# Patient Record
Sex: Female | Born: 1980 | Race: White | Hispanic: No | Marital: Married | Smoking: Never smoker
Health system: Southern US, Community
[De-identification: ages and names within clinical notes are randomized; demographics above are authoritative.]

## PROBLEM LIST (undated history)

## (undated) ENCOUNTER — Inpatient Hospital Stay (HOSPITAL_COMMUNITY): Payer: Self-pay

## (undated) DIAGNOSIS — B977 Papillomavirus as the cause of diseases classified elsewhere: Secondary | ICD-10-CM

## (undated) DIAGNOSIS — Z8619 Personal history of other infectious and parasitic diseases: Secondary | ICD-10-CM

## (undated) DIAGNOSIS — O24419 Gestational diabetes mellitus in pregnancy, unspecified control: Secondary | ICD-10-CM

## (undated) DIAGNOSIS — F419 Anxiety disorder, unspecified: Secondary | ICD-10-CM

## (undated) DIAGNOSIS — N979 Female infertility, unspecified: Secondary | ICD-10-CM

## (undated) HISTORY — DX: Female infertility, unspecified: N97.9

## (undated) HISTORY — PX: OTHER SURGICAL HISTORY: SHX169

## (undated) HISTORY — DX: Personal history of other infectious and parasitic diseases: Z86.19

## (undated) HISTORY — PX: WISDOM TOOTH EXTRACTION: SHX21

## (undated) HISTORY — PX: CHOLECYSTECTOMY: SHX55

## (undated) HISTORY — PX: LEEP: SHX91

## (undated) HISTORY — DX: Gestational diabetes mellitus in pregnancy, unspecified control: O24.419

## (undated) HISTORY — PX: COLPOSCOPY: SHX161

---
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
## 2000-10-20 ENCOUNTER — Other Ambulatory Visit: Admission: RE | Admit: 2000-10-20 | Discharge: 2000-10-20 | Payer: Self-pay | Admitting: Obstetrics & Gynecology

## 2000-10-20 ENCOUNTER — Other Ambulatory Visit: Admission: RE | Admit: 2000-10-20 | Discharge: 2000-10-20 | Payer: Self-pay | Admitting: Gynecology

## 2001-04-25 ENCOUNTER — Ambulatory Visit (HOSPITAL_COMMUNITY): Admission: RE | Admit: 2001-04-25 | Discharge: 2001-04-25 | Payer: Self-pay | Admitting: Internal Medicine

## 2001-10-25 ENCOUNTER — Other Ambulatory Visit: Admission: RE | Admit: 2001-10-25 | Discharge: 2001-10-25 | Payer: Self-pay | Admitting: Obstetrics and Gynecology

## 2002-11-13 ENCOUNTER — Other Ambulatory Visit: Admission: RE | Admit: 2002-11-13 | Discharge: 2002-11-13 | Payer: Self-pay | Admitting: Obstetrics and Gynecology

## 2002-11-14 ENCOUNTER — Other Ambulatory Visit: Admission: RE | Admit: 2002-11-14 | Discharge: 2002-11-14 | Payer: Self-pay | Admitting: Obstetrics and Gynecology

## 2003-09-13 ENCOUNTER — Other Ambulatory Visit: Admission: RE | Admit: 2003-09-13 | Discharge: 2003-09-13 | Payer: Self-pay | Admitting: Obstetrics and Gynecology

## 2003-12-27 ENCOUNTER — Other Ambulatory Visit: Admission: RE | Admit: 2003-12-27 | Discharge: 2003-12-27 | Payer: Self-pay | Admitting: Obstetrics and Gynecology

## 2004-06-04 ENCOUNTER — Other Ambulatory Visit: Admission: RE | Admit: 2004-06-04 | Discharge: 2004-06-04 | Payer: Self-pay | Admitting: Obstetrics and Gynecology

## 2004-12-11 ENCOUNTER — Other Ambulatory Visit: Admission: RE | Admit: 2004-12-11 | Discharge: 2004-12-11 | Payer: Self-pay | Admitting: Obstetrics and Gynecology

## 2004-12-17 ENCOUNTER — Other Ambulatory Visit: Admission: RE | Admit: 2004-12-17 | Discharge: 2004-12-17 | Payer: Self-pay | Admitting: Obstetrics and Gynecology

## 2005-02-23 ENCOUNTER — Ambulatory Visit (HOSPITAL_COMMUNITY): Admission: RE | Admit: 2005-02-23 | Discharge: 2005-02-24 | Payer: Self-pay | Admitting: Internal Medicine

## 2005-02-26 ENCOUNTER — Ambulatory Visit: Payer: Self-pay | Admitting: *Deleted

## 2005-05-21 ENCOUNTER — Other Ambulatory Visit: Admission: RE | Admit: 2005-05-21 | Discharge: 2005-05-21 | Payer: Self-pay | Admitting: Obstetrics and Gynecology

## 2006-09-14 ENCOUNTER — Ambulatory Visit (HOSPITAL_COMMUNITY): Admission: RE | Admit: 2006-09-14 | Discharge: 2006-09-14 | Payer: Self-pay | Admitting: Obstetrics and Gynecology

## 2006-09-14 ENCOUNTER — Encounter (INDEPENDENT_AMBULATORY_CARE_PROVIDER_SITE_OTHER): Payer: Self-pay | Admitting: Specialist

## 2007-05-08 ENCOUNTER — Emergency Department (HOSPITAL_COMMUNITY): Admission: EM | Admit: 2007-05-08 | Discharge: 2007-05-08 | Payer: Self-pay | Admitting: Emergency Medicine

## 2007-06-13 ENCOUNTER — Ambulatory Visit: Payer: Self-pay | Admitting: Cardiology

## 2007-08-03 ENCOUNTER — Encounter: Payer: Self-pay | Admitting: Cardiology

## 2007-08-03 ENCOUNTER — Ambulatory Visit: Payer: Self-pay

## 2008-12-31 ENCOUNTER — Ambulatory Visit (HOSPITAL_COMMUNITY): Admission: RE | Admit: 2008-12-31 | Discharge: 2008-12-31 | Payer: Self-pay | Admitting: Internal Medicine

## 2008-12-31 ENCOUNTER — Ambulatory Visit: Payer: Self-pay | Admitting: Internal Medicine

## 2009-07-17 ENCOUNTER — Ambulatory Visit (HOSPITAL_COMMUNITY): Admission: RE | Admit: 2009-07-17 | Discharge: 2009-07-17 | Payer: Self-pay | Admitting: Obstetrics and Gynecology

## 2010-04-15 ENCOUNTER — Ambulatory Visit (HOSPITAL_COMMUNITY)
Admission: RE | Admit: 2010-04-15 | Discharge: 2010-04-15 | Payer: Self-pay | Source: Home / Self Care | Attending: Internal Medicine | Admitting: Internal Medicine

## 2010-09-16 NOTE — Op Note (Signed)
Jennifer Mccormick, Jennifer Mccormick            ACCOUNT NO.:  [id number]   MEDICAL RECORD NO.:  [id number]          PATIENT TYPE:  AMB   LOCATION:  DAY                           FACILITY:  [facility]   PHYSICIAN:  R. Roetta Sessions, M.D. DATE OF BIRTH:  11-Aug-1980   DATE OF PROCEDURE:  12/31/2008  DATE OF DISCHARGE:                               OPERATIVE REPORT   INDICATIONS FOR PROCEDURE:  A 30 year old lady with a 5-day history of  difficulty swallowing pills and solid food.  She has a history of panic  attacks,  came off her Prozac recently but has started back on that  agent prior to 5 days ago.  Absolutely denies any odynophagia or  dysphagia.  There are no signs of reflux symptoms, nausea, vomiting,  melena, hematochezia.  She is here for EGD.  She is referred by Dr.  Ouida Sills. Risks, benefits, alternatives, limitations have been discussed  with the patient at bedside and questions answered.  Potential for  esophageal dilation as appropriate reviewed.  Please see documentation  in the medical record.   PROCEDURE NOTE:  O2 saturation, blood pressure, pulse and respirations  were monitored throughout the entire procedure.   CONSCIOUS SEDATION:  Versed 7 mg IV,. Demerol 150 mg IV in divided  doses.  Cetacaine spray for topical pharyngeal anesthesia.   INSTRUMENT:  Pentax video chip system.   FINDINGS:  Examination of tubular esophagus revealed a widely patent,  normal-appearing esophageal mucosa.  EG junction was easily traversed  and __________ gastric cavity was emptied and insufflated well with air.  Thorough examination of gastric mucosa included retroflexion into the  proximal stomach, esophagogastric junction, which demonstrated a couple  of tiny antral erosions.  There was no infiltrating process or ulcer or  other abnormality.  Pylorus was patent and easily traversed.  Examination of bulb and second portion revealed no abnormalities.   THERAPEUTIC/DIAGNOSTIC MANEUVERS PERFORMED:  None.   The patient tolerated the procedure well __________ endoscopy.   IMPRESSION:  Normal tubular esophagus, normal esophageal mucosa, tiny  antral erosions, otherwise normal stomach, D1, D2.   I suspect symptoms may be falling more into the realm of globus  hystericus.  Today's findings are reassuring.   RECOMMENDATIONS:  1. Brief course of Protonix 40 mg orally daily.  2. She is to follow up with Dr. Ouida Sills to optimize her medical regimen      for panic attacks.  3. She was reassured that her dysphagia symptoms will likely go away      by themselves. If they persist, would give some consideration to a      barium pill esophagram. This is likely not going to be needed.      Jonathon Bellows, M.D.  Electronically Signed     RMR/MEDQ  D:  12/31/2008  T:  12/31/2008  Job:  [id number]   cc:   Kingsley Callander. Ouida Sills, MD  Fax: [phone]

## 2010-09-16 NOTE — Assessment & Plan Note (Signed)
[facility]                            CARDIOLOGY OFFICE NOTE   NAME:GRIGGS, KEATYN LUCK                    MRN:          [id number]  DATE:06/13/2007                            DOB:          [birth year]    HISTORY OF PRESENT ILLNESS:  Rae Plotner is a very pleasant 30-year-  old female.  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  She is here with a friend.  She is quite  active.  She has no major medical problems.  Historically, she has had  some palpitations.  She had a 2-D echo in the remote past at [facility].  She has also worn a Holter monitor in the past.  Most recently, starting  in approximately 2007 her episodes have been somewhat more marked.  She  is noted that when sitting still she can begin to see the some type of  flashing lights and feel poorly and noticed that her heart rate is  increased to approximately 160.  She does not have true syncope.  There  is slight presyncope.  These episodes can then last from 10-30 minutes.  When the rhythm breaks, she feels fatigued for a period of time.  She  has very minimal caffeine intake.  She does not have chest pain with  this.  These episodes are not exercise related.   The patient has no significant cardiac disease in the family.  She has  no history in the family of sudden cardiac death.  Her grandfather died  in his mid 1s of coronary disease.   PAST MEDICAL HISTORY:   ALLERGIES:  NO KNOWN DRUG ALLERGIES.   MEDICATIONS:  1. Fluoxetine 20.  2. Microgestin 1.5 mg daily.   OTHER MEDICAL PROBLEMS:  See the list below.   SOCIAL HISTORY:  She is single.  She is a respiratory therapist at [employer].  She does not smoke and/or drink excessively.   FAMILY HISTORY:  There is no strong family history of coronary disease.   REVIEW OF SYSTEMS:  Other than the HPI.  She has had no significant  problems.  Her review of systems otherwise  is negative.   PHYSICAL EXAMINATION:  VITAL SIGNS:  Weight is 216 pounds.  Blood  pressure is 135/88 with a pulse of 63.  GENERAL:  The patient is oriented to person, time and place.  Affect is  normal.  HEENT:  Reveals no xanthelasma.  She has normal extraocular motion.  There are no carotid bruits.  There is no jugular venous distention.  LUNGS:  Clear.  Respiratory effort is not labored.  CARDIAC:  Reveals S1-S2.  There is a 2/6 systolic murmur.  The murmur  does not change with squatting or standing after squatting.  ABDOMEN:  Obese but soft.  There is no peripheral edema.  The patient is  overweight as noted.   STUDIES:  EKG as of today appears to be normal.   PROBLEMS:  1. History of some type of cryotherapy.  2. History of gallbladder removal in the past.  3.  Palpitations.  It appears that the patient does have an episode of      some type of supraventricular tachycardia.  She says that the rate      is in the range of 160.  She does feel these and they can last for      10-30 minutes.  We will proceed with a 2-D echo to be sure that her      LV function and valvular function is normal.  Her last echo was      done in the range of 8-10 years ago.  We also will place an event      recorder.  The patient does say that she think she is now having      these spells in the range of three times per week.  I believe that      we can capture one of them on her multi week recorder.  No      medicines will be started.  At some point we may empirically try a      small dose of beta blocker or calcium blocker, but not at this      point.   I had a full discussion with her and she is in agreement and I will see  her back for follow-up.     Luis Abed, MD, [facility]  Electronically Signed    JDK/MedQ  DD: 06/13/2007  DT: 06/14/2007  Job #: [id number]   cc:   Kingsley Callander. Ouida Sills, MD

## 2010-09-19 NOTE — Op Note (Signed)
Jennifer Mccormick, Jennifer Mccormick             ACCOUNT NO.:  [id number]   MEDICAL RECORD NO.:  [id number]          PATIENT TYPE:  AMB   LOCATION:  SDC                           FACILITY:  [facility]   PHYSICIAN:  Randye Lobo, M.D.   DATE OF BIRTH:  01/05/1981   DATE OF PROCEDURE:  09/14/2006  DATE OF DISCHARGE:                               OPERATIVE REPORT   PREOPERATIVE DIAGNOSIS:  Cervical intra-epithelial neoplasia 2.   POSTOPERATIVE DIAGNOSIS:  Cervical intra-epithelial neoplasia 2.   PROCEDURE:  Loop electrosurgical excision procedure.   SURGEON:  Randye Lobo, M.D.   ANESTHESIA:  MAC, local with 1% lidocaine with 1:200,000 epinephrine.   ESTIMATED BLOOD LOSS:  Minimal.   COMPLICATIONS:  None.   INDICATIONS FOR PROCEDURE:  The patient is a 30 year old gravida 63  Caucasian female who has a history of  abnormal Pap smear since 2004.  The patient had initial colposcopy in 2004, which documented benign  endocervix and benign exocervical biopsies.  The patient was followed  with observation management and repeat Pap smears.  She ultimately ended  up undergoing a repeat colposcopy in October 2006, for a Pap smear  showing both low-grade and high-grade squamous intra-epithelial lesions.  The colposcopy at that time was satisfactory and the ECC was negative.  Exocervical biopsies showed low-grade squamous intra-epithelial lesion.  The patient subsequently again was followed with observational  management and Pap smears and her Pap smears had been atypical squamous  cells, of undetermined significance and low-grade squamous intra-  epithelial lesion, until her most recent Pap smear on May 31, 2006,  which documented ascus, cannot rule out high-grade squamous intra-  epithelial lesion.  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  The patient underwent a colposcopy on July 28, 2006, which was  satisfactory.  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  The patient was noted to have a  large squamocolumnar junction.  Options for care were discussed with the  patient and the recommendation was made to proceed with a LEEP procedure  at the [facility] [place], after the risks, benefits and  alternatives were discussed.   FINDINGS:  Colposcopy at the time of surgery documented no evidence of  any punctations, mosaics or abnormal vessels.  There was a thin rim of  some aceto-white change which was noted mostly on the cervix on the  patient's right-hand side in the 9 o'clock region.  Again, the  squamocolumnar junction was noted to be quite large over the face of the  cervix.  The colposcopy was noted to be satisfactory.   DESCRIPTION OF PROCEDURE:  The patient was re-identified in the  preoperative holding area.  She was taken down to the operating room  where she was placed in the dorsal lithotomy position.  A MAC anesthetic  was administered.  A speculum was placed inside the vagina.  The  speculum with the lateral vaginal wall retractor was used.  Acetic acid  was used to perform the colposcopy.  The findings are as noted above.  Lugol's solution was then applied to the cervix, to clearly identify the  squamocolumnar junction.  Local 1% lidocaine with epinephrine 1:200,000  was administered in a circumferential fashion around the cervix.  The  LEEP procedure was then performed using a cutting setting of 60 watts.  The LEEP was performed  in three separate specimens.  The first specimen  was the inferior half of the cervix.  The second specimen was a superior  half of the cervix along the squamocolumnar junction.  The third  specimen was the endocervix.  Each of these were marked separately on a  cork board and sent to pathology.  Coagulation was then used along the  perimeter of the surgical field, to create good hemostasis.  Monsel was  then placed over the treatment area.  Hemostasis was good.   The speculum  retractor was removed at this time.   This completed the patient's procedure.  There were no complications.  All needle, instrument and sponge counts were correct.      Randye Lobo, M.D.  Electronically Signed     BES/MEDQ  D:  09/14/2006  T:  09/14/2006  Job:  [id number]

## 2011-01-09 ENCOUNTER — Other Ambulatory Visit: Payer: Self-pay | Admitting: Obstetrics and Gynecology

## 2011-04-07 ENCOUNTER — Other Ambulatory Visit: Payer: Self-pay

## 2011-05-05 NOTE — L&D Delivery Note (Signed)
Delivery Note At 8:10 AM a viable and healthy female was delivered via Vaginal, Spontaneous Delivery (Presentation: Right; Occiput Anterior).  APGAR: 7, 9; weight 8 lb 15 oz (4055 g).   Placenta status: Intact, Spontaneous.  Cord: 3 vessels with a loose nuchal cord x 1 reduced on the perineum.  Pts uterus was a little boggy after delivery.  It responded to bimanual massage.  misoprostal were placed rectally prophylactically.  Anesthesia: Epidural  Episiotomy: None Lacerations: None Suture Repair: NA Est. Blood Loss (mL): 300  Mom to postpartum.  Baby to nursery-stable.  Alegandra Sommers H. 10/25/2011, 8:47 AM

## 2011-05-07 LAB — OB RESULTS CONSOLE ABO/RH: RH Type: NEGATIVE

## 2011-05-07 LAB — OB RESULTS CONSOLE HIV ANTIBODY (ROUTINE TESTING): HIV: NONREACTIVE

## 2011-05-07 LAB — OB RESULTS CONSOLE GC/CHLAMYDIA
Chlamydia: NEGATIVE
Gonorrhea: NEGATIVE

## 2011-08-17 ENCOUNTER — Inpatient Hospital Stay (HOSPITAL_COMMUNITY)
Admission: AD | Admit: 2011-08-17 | Discharge: 2011-08-17 | Disposition: A | Discharge status: Home / Self Care | Payer: BC Managed Care – PPO | Source: Ambulatory Visit | Attending: Obstetrics and Gynecology | Admitting: Obstetrics and Gynecology

## 2011-08-17 ENCOUNTER — Encounter (HOSPITAL_COMMUNITY): Payer: Self-pay | Admitting: *Deleted

## 2011-08-17 DIAGNOSIS — E860 Dehydration: Secondary | ICD-10-CM | POA: Insufficient documentation

## 2011-08-17 DIAGNOSIS — R197 Diarrhea, unspecified: Secondary | ICD-10-CM

## 2011-08-17 DIAGNOSIS — R112 Nausea with vomiting, unspecified: Secondary | ICD-10-CM

## 2011-08-17 DIAGNOSIS — O212 Late vomiting of pregnancy: Secondary | ICD-10-CM | POA: Insufficient documentation

## 2011-08-17 LAB — CBC
HCT: 33.8 % — ABNORMAL LOW (ref 36.0–46.0)
MCHC: 33.4 g/dL (ref 30.0–36.0)
MCV: 84.3 fL (ref 78.0–100.0)
Platelets: 223 10*3/uL (ref 150–400)
RBC: 4.01 MIL/uL (ref 3.87–5.11)

## 2011-08-17 LAB — COMPREHENSIVE METABOLIC PANEL
ALT: 6 U/L (ref 0–35)
BUN: 7 mg/dL (ref 6–23)
CO2: 23 mEq/L (ref 19–32)
GFR calc Af Amer: >90 mL/min (ref >90)
GFR calc non Af Amer: >90 mL/min (ref >90)
Total Bilirubin: 0.2 mg/dL — ABNORMAL LOW (ref 0.3–1.2)
Total Protein: 6.3 g/dL (ref 6.0–8.3)

## 2011-08-17 LAB — URINALYSIS, ROUTINE W REFLEX MICROSCOPIC
Bilirubin Urine: NEGATIVE
Glucose, UA: NEGATIVE mg/dL
Ketones, ur: >80 mg/dL — AB
Leukocytes, UA: NEGATIVE
Protein, ur: NEGATIVE mg/dL
Specific Gravity, Urine: 1.025 (ref 1.005–1.030)
pH: 6 (ref 5.0–8.0)

## 2011-08-17 MED ORDER — PROMETHAZINE HCL 25 MG PO TABS: 25.0000 mg | ORAL_TABLET | Freq: Four times a day (QID) | ORAL | Status: DC | PRN

## 2011-08-17 NOTE — MAU Note (Signed)
Pt c/o of nausea,diaainess and diarrhea x2 that started since 0400

## 2011-08-17 NOTE — MAU Note (Signed)
PT SAYS   FELT DIZZY AT 0400- NEVER HAS BEFORE- NO MEDS.  SAYS SLIGHTLY DIZZY  WHEN WALKING  FROM B-ROOM  TO ROOM.  SAYS HAS HAD RINGING  IN  LEFT EAR .    3X IN MONTH.  WAS GOING TO TELL DR AT APPOINTMENT  THIS AM.     SAYS TODAY HAS CONGESTED IN NOSE.

## 2011-08-17 NOTE — MAU Provider Note (Signed)
History   Pt presents today c/o one episode of vomiting at about 4am. She states she woke up feeling hot and had a "ringing in her ear." Shortly after, she vomited and had two loose, but formed stools. She states she has felt much better since that time. She denies fever, abd pain, vag dc, bleeding, or any other sx at this time. She has an appt with her OB provider at 8:45am today. She reports GFM.  CSN: [id number]  Arrival date and time: 08/17/11 0610   None     Chief Complaint  Patient presents with  . Nausea   HPI  OB History    Grav Para Term Preterm Abortions TAB SAB Ect Mult Living   1               No past medical history on file.  Past Surgical History  Procedure Date  . Wisdom tooth extraction   . Cholestectomy   . Leep   . Invitrofert   . Invitrofertilization     Family History  Problem Relation Age of Onset  . Hypertension Mother     History  Substance Use Topics  . Smoking status: Never Smoker   . Smokeless tobacco: Not on file  . Alcohol Use: No    Allergies: No Known Allergies  Prescriptions prior to admission  Medication Sig Dispense Refill  . FLUoxetine (PROZAC) 20 MG capsule Take 20 mg by mouth daily.      . Prenatal Vit-Fe Fumarate-FA (PRENATAL MULTIVITAMIN) TABS Take 1 tablet by mouth daily.        Review of Systems  Constitutional: Negative for fever and chills.  Eyes: Negative for blurred vision and double vision.  Respiratory: Negative for cough, hemoptysis, sputum production, shortness of breath and wheezing.   Cardiovascular: Negative for chest pain and palpitations.  Gastrointestinal: Positive for nausea, vomiting and diarrhea. Negative for abdominal pain, constipation and blood in stool.  Genitourinary: Negative for dysuria, urgency, frequency, hematuria and flank pain.  Neurological: Negative for dizziness, tingling and headaches.  Psychiatric/Behavioral: Negative for depression and substance abuse.   Physical Exam   Blood  pressure 135/66, pulse 74, temperature 97.8 F (36.6 C), temperature source Oral, resp. rate 20, height 5\' 4"  (1.626 m), weight 237 lb (107.502 kg), SpO2 100.00%.  Physical Exam  Nursing note and vitals reviewed. Constitutional: She is oriented to person, place, and time. She appears well-developed and well-nourished. No distress.  HENT:  Head: Normocephalic and atraumatic.  Eyes: EOM are normal. Pupils are equal, round, and reactive to light.  Cardiovascular: Normal rate and regular rhythm.  Exam reveals no gallop and no friction rub.   Murmur heard. Respiratory: Effort normal. No respiratory distress. She has no wheezes. She has no rales. She exhibits no tenderness.  GI: Soft. She exhibits no distension and no mass. There is no tenderness. There is no rebound and no guarding.  Neurological: She is alert and oriented to person, place, and time.  Skin: Skin is warm and dry. She is not diaphoretic.  Psychiatric: She has a normal mood and affect. Her behavior is normal. Judgment and thought content normal.    MAU Course  Procedures  Results for orders placed during the hospital encounter of 08/17/11 (from the past 24 hour(s))  URINALYSIS, ROUTINE W REFLEX MICROSCOPIC     Status: Abnormal   Collection Time   08/17/11  6:25 AM      Component Value Range   Color, Urine YELLOW  YELLOW  APPearance CLEAR  CLEAR    Specific Gravity, Urine 1.025  1.005 - 1.030    pH 6.0  5.0 - 8.0    Glucose, UA NEGATIVE  NEGATIVE (mg/dL)   Hgb urine dipstick NEGATIVE  NEGATIVE    Bilirubin Urine NEGATIVE  NEGATIVE    Ketones, ur >80 (*) NEGATIVE (mg/dL)   Protein, ur NEGATIVE  NEGATIVE (mg/dL)   Urobilinogen, UA 0.2  0.0 - 1.0 (mg/dL)   Nitrite NEGATIVE  NEGATIVE    Leukocytes, UA NEGATIVE  NEGATIVE   CBC     Status: Abnormal   Collection Time   08/17/11  6:35 AM      Component Value Range   WBC 11.0 (*) 4.0 - 10.5 (K/uL)   RBC 4.01  3.87 - 5.11 (MIL/uL)   Hemoglobin 11.3 (*) 12.0 - 15.0 (g/dL)    HCT 16.1 (*) 09.6 - 46.0 (%)   MCV 84.3  78.0 - 100.0 (fL)   MCH 28.2  26.0 - 34.0 (pg)   MCHC 33.4  30.0 - 36.0 (g/dL)   RDW 04.5  40.9 - 81.1 (%)   Platelets 223  150 - 400 (K/uL)  COMPREHENSIVE METABOLIC PANEL     Status: Abnormal   Collection Time   08/17/11  6:35 AM      Component Value Range   Sodium 134 (*) 135 - 145 (mEq/L)   Potassium 4.0  3.5 - 5.1 (mEq/L)   Chloride 101  96 - 112 (mEq/L)   CO2 23  19 - 32 (mEq/L)   Glucose, Bld 89  70 - 99 (mg/dL)   BUN 7  6 - 23 (mg/dL)   Creatinine, Ser 9.14 (*) 0.50 - 1.10 (mg/dL)   Calcium 9.3  8.4 - 78.2 (mg/dL)   Total Protein 6.3  6.0 - 8.3 (g/dL)   Albumin 3.0 (*) 3.5 - 5.2 (g/dL)   AST 6  0 - 37 (U/L)   ALT 6  0 - 35 (U/L)   Alkaline Phosphatase 57  39 - 117 (U/L)   Total Bilirubin 0.2 (*) 0.3 - 1.2 (mg/dL)   GFR calc non Af Amer >90  >90 (mL/min)   GFR calc Af Amer >90  >90 (mL/min)    NST reactive with no ctx. Assessment and Plan  N&V/dehydration: discussed with pt at length. She will keep her scheduled appt today with her OB provider. She will use Imodium for prn diarrhea. Will give Rx for phenergan to use for nausea. Discussed diet, activity, risks, and precautions.  Clinton Gallant. Mykell Genao III, DrHSc, MPAS, PA-C  08/17/2011, 6:57 AM

## 2011-08-17 NOTE — Discharge Instructions (Signed)
Nausea and Vomiting   Nausea means you feel sick to your stomach. Throwing up (vomiting) is a reflex where stomach contents come out of your mouth.   HOME CARE   Take medicine as told by your doctor.   Do not force yourself to eat. However, you do need to drink fluids.   If you feel like eating, eat a normal diet as told by your doctor.   Eat Ramiel Forti, wheat, potatoes, bread, lean meats, yogurt, fruits, and vegetables.   Avoid high-fat foods.   Drink enough fluids to keep your pee (urine) clear or pale yellow.   Ask your doctor how to replace body fluid losses (rehydrate). Signs of body fluid loss (dehydration) include:   Feeling very thirsty.   Dry lips and mouth.   Feeling dizzy.   Dark pee.   Peeing less than normal.   Feeling confused.   Fast breathing or heart rate.   GET HELP RIGHT AWAY IF:   You have blood in your throw up.   You have black or bloody poop (stool).   You have a bad headache or stiff neck.   You feel confused.   You have bad belly (abdominal) pain.   You have chest pain or trouble breathing.   You do not pee at least once every 8 hours.   You have cold, clammy skin.   You keep throwing up after 24 to 48 hours.   You have a fever.   MAKE SURE YOU:   Understand these instructions.   Will watch your condition.   Will get help right away if you are not doing well or get worse.   Document Released: 10/07/2007 Document Revised: 04/09/2011 Document Reviewed: 09/19/2010   [employer] Patient Information 2012 [employer].

## 2011-08-17 NOTE — MAU Note (Signed)
SAYS HAD LOOSE STOOL X2  WHEN DIIZZY AT HOME.    VOMITED ON WAY TO HOSPITAL.   NO NAUSEA NOW- PO WATER IN ROOM

## 2011-10-06 LAB — OB RESULTS CONSOLE GBS: GBS: NEGATIVE

## 2011-10-23 ENCOUNTER — Telehealth (HOSPITAL_COMMUNITY): Payer: Self-pay | Admitting: *Deleted

## 2011-10-23 ENCOUNTER — Encounter (HOSPITAL_COMMUNITY): Payer: Self-pay | Admitting: *Deleted

## 2011-10-23 NOTE — Telephone Encounter (Signed)
Preadmission screen  

## 2011-10-24 ENCOUNTER — Encounter (HOSPITAL_COMMUNITY): Payer: Self-pay | Admitting: *Deleted

## 2011-10-24 ENCOUNTER — Inpatient Hospital Stay (HOSPITAL_COMMUNITY)
Admission: AD | Admit: 2011-10-24 | Discharge: 2011-10-26 | DRG: 372 | Disposition: A | Discharge status: Home / Self Care | Payer: BC Managed Care – PPO | Source: Ambulatory Visit | Attending: Obstetrics and Gynecology | Admitting: Obstetrics and Gynecology

## 2011-10-24 DIAGNOSIS — O99814 Abnormal glucose complicating childbirth: Principal | ICD-10-CM | POA: Diagnosis present

## 2011-10-24 LAB — TYPE AND SCREEN
ABO/RH(D): O NEG
Antibody Screen: POSITIVE
DAT, IgG: NEGATIVE

## 2011-10-24 LAB — CBC
HCT: 36.1 % (ref 36.0–46.0)
Hemoglobin: 12.2 g/dL (ref 12.0–15.0)
RBC: 4.43 MIL/uL (ref 3.87–5.11)
RDW: 14.1 % (ref 11.5–15.5)
WBC: 11.2 10*3/uL — ABNORMAL HIGH (ref 4.0–10.5)

## 2011-10-24 MED ORDER — FENTANYL 2.5 MCG/ML BUPIVACAINE 1/10 % EPIDURAL INFUSION (WH - ANES)
14.0000 mL/h | INTRAMUSCULAR | Status: DC
  Administered 2011-10-25: 14 mL/h via EPIDURAL
  Filled 2011-10-24 (×2): qty 60

## 2011-10-24 MED ORDER — DIPHENHYDRAMINE HCL 50 MG/ML IJ SOLN: 12.5000 mg | INTRAMUSCULAR | Status: DC | PRN

## 2011-10-24 MED ORDER — CITRIC ACID-SODIUM CITRATE 334-500 MG/5ML PO SOLN: 30.0000 mL | ORAL | Status: DC | PRN

## 2011-10-24 MED ORDER — OXYTOCIN BOLUS FROM INFUSION
250.0000 mL | Freq: Once | INTRAVENOUS | Status: AC
  Administered 2011-10-25: 250 mL via INTRAVENOUS
  Filled 2011-10-24: qty 500

## 2011-10-24 MED ORDER — LACTATED RINGERS IV SOLN: 500.0000 mL | INTRAVENOUS | Status: DC | PRN

## 2011-10-24 MED ORDER — OXYTOCIN 40 UNITS IN LACTATED RINGERS INFUSION - SIMPLE MED
62.5000 mL/h | Freq: Once | INTRAVENOUS | Status: AC
Start: 2011-10-24 — End: 2011-10-25
  Administered 2011-10-25: 2.5 [IU]/h via INTRAVENOUS

## 2011-10-24 MED ORDER — OXYCODONE-ACETAMINOPHEN 5-325 MG PO TABS: 1.0000 | ORAL_TABLET | ORAL | Status: DC | PRN

## 2011-10-24 MED ORDER — FLEET ENEMA 7-19 GM/118ML RE ENEM: 1.0000 | ENEMA | RECTAL | Status: DC | PRN

## 2011-10-24 MED ORDER — PHENYLEPHRINE 40 MCG/ML (10ML) SYRINGE FOR IV PUSH (FOR BLOOD PRESSURE SUPPORT)
80.0000 ug | PREFILLED_SYRINGE | INTRAVENOUS | Status: DC | PRN
  Filled 2011-10-24: qty 5

## 2011-10-24 MED ORDER — BUTORPHANOL TARTRATE 2 MG/ML IJ SOLN
1.0000 mg | INTRAMUSCULAR | Status: DC | PRN
  Administered 2011-10-24 – 2011-10-25 (×2): 1 mg via INTRAVENOUS
  Filled 2011-10-24 (×2): qty 1

## 2011-10-24 MED ORDER — LACTATED RINGERS IV SOLN
INTRAVENOUS | Status: DC
  Administered 2011-10-24 – 2011-10-25 (×3): via INTRAVENOUS

## 2011-10-24 MED ORDER — OXYTOCIN 40 UNITS IN LACTATED RINGERS INFUSION - SIMPLE MED
1.0000 m[IU]/min | INTRAVENOUS | Status: DC
  Administered 2011-10-24: 2 m[IU]/min via INTRAVENOUS
  Filled 2011-10-24 (×4): qty 1000

## 2011-10-24 MED ORDER — ONDANSETRON HCL 4 MG/2ML IJ SOLN: 4.0000 mg | Freq: Four times a day (QID) | INTRAMUSCULAR | Status: DC | PRN

## 2011-10-24 MED ORDER — PHENYLEPHRINE 40 MCG/ML (10ML) SYRINGE FOR IV PUSH (FOR BLOOD PRESSURE SUPPORT): 80.0000 ug | PREFILLED_SYRINGE | INTRAVENOUS | Status: DC | PRN

## 2011-10-24 MED ORDER — EPHEDRINE 5 MG/ML INJ: 10.0000 mg | INTRAVENOUS | Status: DC | PRN

## 2011-10-24 MED ORDER — IBUPROFEN 600 MG PO TABS: 600.0000 mg | ORAL_TABLET | Freq: Four times a day (QID) | ORAL | Status: DC | PRN

## 2011-10-24 MED ORDER — ACETAMINOPHEN 325 MG PO TABS: 650.0000 mg | ORAL_TABLET | ORAL | Status: DC | PRN

## 2011-10-24 MED ORDER — TERBUTALINE SULFATE 1 MG/ML IJ SOLN: 0.2500 mg | Freq: Once | INTRAMUSCULAR | Status: AC | PRN

## 2011-10-24 MED ORDER — LIDOCAINE HCL (PF) 1 % IJ SOLN: 30.0000 mL | INTRAMUSCULAR | Status: DC | PRN

## 2011-10-24 MED ORDER — LACTATED RINGERS IV SOLN: 500.0000 mL | Freq: Once | INTRAVENOUS | Status: DC

## 2011-10-24 MED ORDER — EPHEDRINE 5 MG/ML INJ
10.0000 mg | INTRAVENOUS | Status: DC | PRN
  Filled 2011-10-24: qty 4

## 2011-10-24 NOTE — MAU Provider Note (Signed)
RN requested speculum exam to confirm ROM.   Speculum exam done.  Small amount of pooling seen.  Fern slide done.  To be read by RN. By clinical observation - ROM confirmed.

## 2011-10-24 NOTE — MAU Note (Signed)
Had fluid leak out this morning around 0600 has continues to leak fluid, patient is 38 weeks, denies contractions.

## 2011-10-25 ENCOUNTER — Encounter (HOSPITAL_COMMUNITY): Payer: Self-pay | Admitting: *Deleted

## 2011-10-25 ENCOUNTER — Inpatient Hospital Stay (HOSPITAL_COMMUNITY): Payer: BC Managed Care – PPO | Admitting: Anesthesiology

## 2011-10-25 ENCOUNTER — Encounter (HOSPITAL_COMMUNITY): Payer: Self-pay | Admitting: Anesthesiology

## 2011-10-25 MED ORDER — OXYCODONE-ACETAMINOPHEN 5-325 MG PO TABS: 1.0000 | ORAL_TABLET | ORAL | Status: DC | PRN

## 2011-10-25 MED ORDER — DIBUCAINE 1 % RE OINT: 1.0000 "application " | TOPICAL_OINTMENT | RECTAL | Status: DC | PRN

## 2011-10-25 MED ORDER — LANOLIN HYDROUS EX OINT: TOPICAL_OINTMENT | CUTANEOUS | Status: DC | PRN

## 2011-10-25 MED ORDER — WITCH HAZEL-GLYCERIN EX PADS: 1.0000 "application " | MEDICATED_PAD | CUTANEOUS | Status: DC | PRN

## 2011-10-25 MED ORDER — MISOPROSTOL 200 MCG PO TABS
1000.0000 ug | ORAL_TABLET | Freq: Once | ORAL | Status: AC
  Administered 2011-10-25: 1000 ug via VAGINAL

## 2011-10-25 MED ORDER — FENTANYL 2.5 MCG/ML BUPIVACAINE 1/10 % EPIDURAL INFUSION (WH - ANES)
INTRAMUSCULAR | Status: DC | PRN
  Administered 2011-10-25: 14 mL/h via EPIDURAL

## 2011-10-25 MED ORDER — SENNOSIDES-DOCUSATE SODIUM 8.6-50 MG PO TABS
2.0000 | ORAL_TABLET | Freq: Every day | ORAL | Status: DC
  Administered 2011-10-25: 2 via ORAL

## 2011-10-25 MED ORDER — DIPHENHYDRAMINE HCL 25 MG PO CAPS: 25.0000 mg | ORAL_CAPSULE | Freq: Four times a day (QID) | ORAL | Status: DC | PRN

## 2011-10-25 MED ORDER — METHYLERGONOVINE MALEATE 0.2 MG/ML IJ SOLN: 0.2000 mg | INTRAMUSCULAR | Status: DC | PRN

## 2011-10-25 MED ORDER — IBUPROFEN 600 MG PO TABS
600.0000 mg | ORAL_TABLET | Freq: Four times a day (QID) | ORAL | Status: DC
  Administered 2011-10-25 – 2011-10-26 (×5): 600 mg via ORAL
  Filled 2011-10-25 (×5): qty 1

## 2011-10-25 MED ORDER — ONDANSETRON HCL 4 MG PO TABS: 4.0000 mg | ORAL_TABLET | ORAL | Status: DC | PRN

## 2011-10-25 MED ORDER — SODIUM BICARBONATE 8.4 % IV SOLN
INTRAVENOUS | Status: DC | PRN
  Administered 2011-10-25: 4 mL via EPIDURAL

## 2011-10-25 MED ORDER — PRENATAL MULTIVITAMIN CH
1.0000 | ORAL_TABLET | Freq: Every day | ORAL | Status: DC
  Administered 2011-10-26: 1 via ORAL
  Filled 2011-10-25: qty 1

## 2011-10-25 MED ORDER — ZOLPIDEM TARTRATE 5 MG PO TABS: 5.0000 mg | ORAL_TABLET | Freq: Every evening | ORAL | Status: DC | PRN

## 2011-10-25 MED ORDER — BENZOCAINE-MENTHOL 20-0.5 % EX AERO
1.0000 "application " | INHALATION_SPRAY | CUTANEOUS | Status: DC | PRN
  Administered 2011-10-25: 1 via TOPICAL
  Filled 2011-10-25: qty 56

## 2011-10-25 MED ORDER — TETANUS-DIPHTH-ACELL PERTUSSIS 5-2.5-18.5 LF-MCG/0.5 IM SUSP: 0.5000 mL | Freq: Once | INTRAMUSCULAR | Status: DC

## 2011-10-25 MED ORDER — METHYLERGONOVINE MALEATE 0.2 MG PO TABS: 0.2000 mg | ORAL_TABLET | ORAL | Status: DC | PRN

## 2011-10-25 MED ORDER — ONDANSETRON HCL 4 MG/2ML IJ SOLN: 4.0000 mg | INTRAMUSCULAR | Status: DC | PRN

## 2011-10-25 MED ORDER — MISOPROSTOL 200 MCG PO TABS
ORAL_TABLET | ORAL | Status: AC
  Filled 2011-10-25: qty 5

## 2011-10-25 MED ORDER — SIMETHICONE 80 MG PO CHEW: 80.0000 mg | CHEWABLE_TABLET | ORAL | Status: DC | PRN

## 2011-10-25 NOTE — H&P (Signed)
Jennifer Mccormick is a 31 y.o. female presenting for Leaking fluid Patient presents complaining of leaking fluid.  Pregnancy has been complicated by diet controlled gestational diabetes mellitus.  At the last office visit 50% of her FBS were elevated.  Given late gestational age the decision was to not start medication but manage with additional diet modification and exercise.  Otherwise pregnancy has been uncomplicated. History OB History    Grav Para Term Preterm Abortions TAB SAB Ect Mult Living   1 1 1  0 0 0 0 0 0 1     Past Medical History  Diagnosis Date  . 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 Gestational diabetes   . History of chlamydia   . H/O varicella    Past Surgical History  Procedure Date  . Wisdom tooth extraction   . Cholestectomy   . Leep   . Invitrofert   . Invitrofertilization   . Colposcopy    Family History: family history includes Alcohol abuse in her father; Diabetes in her brother and maternal grandmother; Heart disease in her maternal grandmother; Hypertension in her maternal aunt, maternal grandmother, and mother; and Kidney disease in her maternal grandmother. Social History:  reports that she has never smoked. She has never used smokeless tobacco. She reports that she does not drink alcohol or use illicit drugs.   Prenatal Transfer Tool  Maternal Diabetes: Yes:  Diabetes Type:  Diet controlled Genetic Screening: Declined Maternal Ultrasounds/Referrals: Normal Fetal Ultrasounds or other Referrals:  Other: see prenatal record Maternal Substance Abuse:  No Significant Maternal Medications:  None Significant Maternal Lab Results:  None Other Comments:  None  ROS: as above  Dilation: 10 Effacement (%): 100 Station: +2 Exam by:: k fields, rn Blood pressure 129/76, pulse 88, temperature 98.6 F (37 C), temperature source Oral, resp. rate 18, height 5\' 4"  (1.626 m), weight 113.399 kg (250 lb), SpO2 100.00%, unknown if currently  breastfeeding. Exam Physical Exam  AFVSS AOX3 Gravid soft NT CVX on admission 3/50/-2 toco quiet  Prenatal labs: ABO, Rh: --/--/O NEG (06/22 1710) Antibody: POS (06/22 1710) Rubella: Immune (01/03 0000) RPR: NON REACTIVE (06/22 1719)  HBsAg: Negative (01/03 0000)  HIV: Non-reactive (01/03 0000)  GBS: Negative (06/04 0000)   Assessment/Plan: 1) Admit 2) Epidural on request 3) Start Pitocin   Jennifer Mccormick H. 10/25/2011, 8:39 AM

## 2011-10-25 NOTE — Anesthesia Procedure Notes (Signed)
Epidural Patient location during procedure: OB  Preanesthetic Checklist Completed: patient identified, site marked, surgical consent, pre-op evaluation, timeout performed, IV checked, risks and benefits discussed and monitors and equipment checked  Epidural Patient position: sitting Prep: site prepped and draped and DuraPrep Patient monitoring: continuous pulse ox and blood pressure Approach: midline Injection technique: LOR air  Needle:  Needle type: Tuohy  Needle gauge: 17 G Needle length: 9 cm Needle insertion depth: 7 cm Catheter type: closed end flexible Catheter size: 19 Gauge Catheter at skin depth: 14 cm Test dose: negative  Assessment Events: blood not aspirated, injection not painful, no injection resistance, negative IV test and no paresthesia  Additional Notes Dosing of Epidural:  1st dose, through needle ............................................. epi 1:200K + Xylocaine 40 mg  2nd dose, through catheter, after waiting 3 minutes.....epi 1:200K + Xylocaine 40 mg  3rd dose, through catheter after waiting 3 minutes .............................Marcaine   4mg   ( mg Marcaine are expressed as equivilent  cc's medication removed from the 0.1%Bupiv / fentanyl syringe from L&D pump)  ( 2% Xylo charted as a single dose in Epic Meds for ease of charting; actual dosing was fractionated as above, for saftey's sake)  As each dose occurred, patient was free of IV sx; and patient exhibited no evidence of SA injection.  Patient is more comfortable after epidural dosed. Please see RN's note for documentation of vital signs,and FHR which are stable.  Patient reminded not to try to ambulate with numb legs, and that an RN must be present the 1st time she attempts to get up.    

## 2011-10-25 NOTE — Anesthesia Preprocedure Evaluation (Addendum)
Anesthesia Evaluation  Patient identified by MRN, date of birth, ID band Patient awake    Reviewed: Allergy & Precautions, H&P , Patient's Chart, lab work & pertinent test results  Airway Mallampati: III TM Distance: >3 FB Neck ROM: full    Dental  (+) Teeth Intact   Pulmonary  breath sounds clear to auscultation        Cardiovascular Rhythm:regular Rate:Normal     Neuro/Psych    GI/Hepatic   Endo/Other  Diabetes mellitus-, GestationalMorbid obesity  Renal/GU      Musculoskeletal   Abdominal   Peds  Hematology   Anesthesia Other Findings       Reproductive/Obstetrics (+) Pregnancy                           Anesthesia Physical Anesthesia Plan  ASA: III  Anesthesia Plan: Epidural   Post-op Pain Management:    Induction:   Airway Management Planned:   Additional Equipment:   Intra-op Plan:   Post-operative Plan:   Informed Consent: I have reviewed the patients History and Physical, chart, labs and discussed the procedure including the risks, benefits and alternatives for the proposed anesthesia with the patient or authorized representative who has indicated his/her understanding and acceptance.   Dental Advisory Given  Plan Discussed with:   Anesthesia Plan Comments: (Labs checked- platelets confirmed with RN in room. Fetal heart tracing, per RN, reported to be stable enough for sitting procedure. Discussed epidural, and patient consents to the procedure:  included risk of possible headache,backache, failed block, allergic reaction, and nerve injury. This patient was asked if she had any questions or concerns before the procedure started. )        Anesthesia Quick Evaluation

## 2011-10-25 NOTE — Anesthesia Postprocedure Evaluation (Signed)
  Anesthesia Post-op Note  Patient: Jennifer Mccormick  Procedure(s) Performed: * No procedures listed *  Patient Location: PACU and Mother/Baby  Anesthesia Type: Epidural  Level of Consciousness: awake, alert  and oriented  Airway and Oxygen Therapy: Patient Spontanous Breathing and Patient connected to nasal cannula oxygen    Post-op Assessment: Patient's Cardiovascular Status Stable and Respiratory Function Stable  Post-op Vital Signs: stable  Complications: No apparent anesthesia complications

## 2011-10-26 LAB — GLUCOSE, CAPILLARY: Glucose-Capillary: 76 mg/dL (ref 70–99)

## 2011-10-26 LAB — CBC
Hemoglobin: 10.1 g/dL — ABNORMAL LOW (ref 12.0–15.0)
MCH: 27.4 pg (ref 26.0–34.0)
MCHC: 33 g/dL (ref 30.0–36.0)
Platelets: 179 10*3/uL (ref 150–400)
RDW: 14.4 % (ref 11.5–15.5)

## 2011-10-26 MED ORDER — IBUPROFEN 600 MG PO TABS: 600.0000 mg | ORAL_TABLET | Freq: Four times a day (QID) | ORAL | Status: AC

## 2011-10-26 MED ORDER — RHO D IMMUNE GLOBULIN 1500 UNIT/2ML IJ SOLN
300.0000 ug | Freq: Once | INTRAMUSCULAR | Status: AC
  Administered 2011-10-26: 300 ug via INTRAMUSCULAR
  Filled 2011-10-26: qty 2

## 2011-10-26 NOTE — Discharge Summary (Signed)
Obstetric Discharge Summary Reason for Admission: rupture of membranes Prenatal Procedures: none Intrapartum Procedures: spontaneous vaginal delivery Postpartum Procedures: Rho(D) Ig Complications-Operative and Postpartum: none Hemoglobin  Date Value Range Status  10/26/2011 10.1* 12.0 - 15.0 g/dL Final     REPEATED TO VERIFY     DELTA CHECK NOTED     HCT  Date Value Range Status  10/26/2011 30.6* 36.0 - 46.0 % Final    Physical Exam:  General: alert and cooperative Lochia: appropriate Uterine Fundus: firm DVT Evaluation: No evidence of DVT seen on physical exam.  Discharge Diagnoses: Term Pregnancy-delivered  Discharge Information: Date: 10/26/2011 Activity: pelvic rest Diet: routine Medications: PNV and Ibuprofen Condition: stable Instructions: refer to practice specific booklet Discharge to: home Follow-up Information    Follow up with Almon Hercules., MD in 4 weeks.   Contact information:   [address] [place] [address] [phone]          Newborn Data: Live born female  Birth Weight: 8 lb 15 oz (4055 g) APGAR: 7, 9  Home with mother.  Jennifer Mccormick 10/26/2011, 9:24 AM

## 2011-10-27 LAB — RH IG WORKUP (INCLUDES ABO/RH)
ABO/RH(D): O NEG
Gestational Age(Wks): 38.2

## 2011-10-30 ENCOUNTER — Inpatient Hospital Stay (HOSPITAL_COMMUNITY): Admission: RE | Admit: 2011-10-30 | Payer: 59 | Source: Ambulatory Visit

## 2011-11-03 ENCOUNTER — Ambulatory Visit (HOSPITAL_COMMUNITY): Admit: 2011-11-03 | Payer: BC Managed Care – PPO

## 2013-04-26 ENCOUNTER — Other Ambulatory Visit: Payer: Self-pay | Admitting: Obstetrics and Gynecology

## 2013-07-08 ENCOUNTER — Encounter (HOSPITAL_COMMUNITY): Payer: Self-pay | Admitting: Emergency Medicine

## 2013-07-08 ENCOUNTER — Emergency Department (HOSPITAL_COMMUNITY)
Admission: EM | Admit: 2013-07-08 | Discharge: 2013-07-08 | Disposition: A | Discharge status: Home / Self Care | Payer: BC Managed Care – PPO | Source: Home / Self Care

## 2013-07-08 DIAGNOSIS — J069 Acute upper respiratory infection, unspecified: Secondary | ICD-10-CM

## 2013-07-08 DIAGNOSIS — H6690 Otitis media, unspecified, unspecified ear: Secondary | ICD-10-CM

## 2013-07-08 DIAGNOSIS — H6692 Otitis media, unspecified, left ear: Secondary | ICD-10-CM

## 2013-07-08 MED ORDER — CEFDINIR 300 MG PO CAPS: 300.0000 mg | ORAL_CAPSULE | Freq: Two times a day (BID) | ORAL | Status: DC

## 2013-07-08 MED ORDER — HYDROCODONE-ACETAMINOPHEN 5-325 MG PO TABS: 1.0000 | ORAL_TABLET | ORAL | Status: DC | PRN

## 2013-07-08 MED ORDER — ANTIPYRINE-BENZOCAINE 5.4-1.4 % OT SOLN: 3.0000 [drp] | OTIC | Status: DC | PRN

## 2013-07-08 NOTE — Discharge Instructions (Signed)
Ear Drops, Adult You need to put eardrops in your ear. HOME CARE   Put drops in your affected ear as told.  After putting in the drops, lay down with the ear you put the drops in facing up. Do this for 10 minutes. Use the ear drops as long as your doctor tells you.  Before you get up, put a cotton ball gently in your ear. Do not push it far in your ear.  Do not wash out your ears unless your doctor says it is okay.  Finish all medicines as told by your doctor. You may be told to keep using the eardrops even if you start to feel better.  See your doctor as told for follow-up visits. GET HELP IF:  You have pain that gets worse.  Any unusual fluid (drainage) is coming from your ear (especially if the fluid stinks).  You have trouble hearing.  You get really dizzy as if the room is spinning and feel sick to your stomach (vertigo).  The outside of your ear becomes red or puffy or both. This may be a sign of an allergic reaction. MAKE SURE YOU:   Understand these instructions.  Will watch your condition.  Will get help right away if you are not doing well or get worse. Document Released: 10/08/2009 Document Revised: 12/21/2012 Document Reviewed: 11/15/2012 [facility] Patient Information 2014 [place].  Otitis Media, Adult Otitis media is redness, soreness, and swelling (inflammation) of the middle ear. Otitis media may be caused by allergies or, most commonly, by infection. Often it occurs as a complication of the common cold. SIGNS AND SYMPTOMS Symptoms of otitis media may include:  Earache.  Fever.  Ringing in your ear.  Headache.  Leakage of fluid from the ear. DIAGNOSIS To diagnose otitis media, your health care provider will examine your ear with an otoscope. This is an instrument that allows your health care provider to see into your ear in order to examine your eardrum. Your health care provider also will ask you questions about your symptoms. TREATMENT    Typically, otitis media resolves on its own within 3 5 days. Your health care provider may prescribe medicine to ease your symptoms of pain. If otitis media does not resolve within 5 days or is recurrent, your health care provider may prescribe antibiotic medicines if he or she suspects that a bacterial infection is the cause. HOME CARE INSTRUCTIONS   Take your medicine as directed until it is gone, even if you feel better after the first few days.  Only take over-the-counter or prescription medicines for pain, discomfort, or fever as directed by your health care provider.  Follow up with your health care provider as directed. SEEK MEDICAL CARE IF:  You have otitis media only in one ear or bleeding from your nose or both.  You notice a lump on your neck.  You are not getting better in 3 5 days.  You feel worse instead of better. SEEK IMMEDIATE MEDICAL CARE IF:   You have pain that is not controlled with medicine.  You have swelling, redness, or pain around your ear or stiffness in your neck.  You notice that part of your face is paralyzed.  You notice that the bone behind your ear (mastoid) is tender when you touch it. MAKE SURE YOU:   Understand these instructions.  Will watch your condition.  Will get help right away if you are not doing well or get worse. Document Released: 01/24/2004 Document Revised:  02/08/2013 Document Reviewed: 11/15/2012 [employer] Patient Information 2014 [place], [place].  Upper Respiratory Infection, Adult An upper respiratory infection (URI) is also sometimes known as the common cold. The upper respiratory tract includes the nose, sinuses, throat, trachea, and bronchi. Bronchi are the airways leading to the lungs. Most people improve within 1 week, but symptoms can last up to 2 weeks. A residual cough may last even longer.  CAUSES Many different viruses can infect the tissues lining the upper respiratory tract. The tissues become irritated and  inflamed and often become very moist. Mucus production is also common. A cold is contagious. You can easily spread the virus to others by oral contact. This includes kissing, sharing a glass, coughing, or sneezing. Touching your mouth or nose and then touching a surface, which is then touched by another person, can also spread the virus. SYMPTOMS  Symptoms typically develop 1 to 3 days after you come in contact with a cold virus. Symptoms vary from person to person. They may include:  Runny nose.  Sneezing.  Nasal congestion.  Sinus irritation.  Sore throat.  Loss of voice (laryngitis).  Cough.  Fatigue.  Muscle aches.  Loss of appetite.  Headache.  Low-grade fever. DIAGNOSIS  You might diagnose your own cold based on familiar symptoms, since most people get a cold 2 to 3 times a year. Your caregiver can confirm this based on your exam. Most importantly, your caregiver can check that your symptoms are not due to another disease such as strep throat, sinusitis, pneumonia, asthma, or epiglottitis. Blood tests, throat tests, and X-rays are not necessary to diagnose a common cold, but they may sometimes be helpful in excluding other more serious diseases. Your caregiver will decide if any further tests are required. RISKS AND COMPLICATIONS  You may be at risk for a more severe case of the common cold if you smoke cigarettes, have chronic heart disease (such as heart failure) or lung disease (such as asthma), or if you have a weakened immune system. The very young and very old are also at risk for more serious infections. Bacterial sinusitis, middle ear infections, and bacterial pneumonia can complicate the common cold. The common cold can worsen asthma and chronic obstructive pulmonary disease (COPD). Sometimes, these complications can require emergency medical care and may be life-threatening. PREVENTION  The best way to protect against getting a cold is to practice good hygiene. Avoid  oral or hand contact with people with cold symptoms. Wash your hands often if contact occurs. There is no clear evidence that vitamin C, vitamin E, echinacea, or exercise reduces the chance of developing a cold. However, it is always recommended to get plenty of rest and practice good nutrition. TREATMENT  Treatment is directed at relieving symptoms. There is no cure. Antibiotics are not effective, because the infection is caused by a virus, not by bacteria. Treatment may include:  Increased fluid intake. Sports drinks offer valuable electrolytes, sugars, and fluids.  Breathing heated mist or steam (vaporizer or shower).  Eating chicken soup or other clear broths, and maintaining good nutrition.  Getting plenty of rest.  Using gargles or lozenges for comfort.  Controlling fevers with ibuprofen or acetaminophen as directed by your caregiver.  Increasing usage of your inhaler if you have asthma. Zinc gel and zinc lozenges, taken in the first 24 hours of the common cold, can shorten the duration and lessen the severity of symptoms. Pain medicines may help with fever, muscle aches, and throat pain. A variety of  non-prescription medicines are available to treat congestion and runny nose. Your caregiver can make recommendations and may suggest nasal or lung inhalers for other symptoms.  HOME CARE INSTRUCTIONS   Only take over-the-counter or prescription medicines for pain, discomfort, or fever as directed by your caregiver.  Use a warm mist humidifier or inhale steam from a shower to increase air moisture. This may keep secretions moist and make it easier to breathe.  Drink enough water and fluids to keep your urine clear or pale yellow.  Rest as needed.  Return to work when your temperature has returned to normal or as your caregiver advises. You may need to stay home longer to avoid infecting others. You can also use a face mask and careful hand washing to prevent spread of the virus. SEEK  MEDICAL CARE IF:   After the first few days, you feel you are getting worse rather than better.  You need your caregiver's advice about medicines to control symptoms.  You develop chills, worsening shortness of breath, or brown or red sputum. These may be signs of pneumonia.  You develop yellow or brown nasal discharge or pain in the face, especially when you bend forward. These may be signs of sinusitis.  You develop a fever, swollen neck glands, pain with swallowing, or white areas in the back of your throat. These may be signs of strep throat. SEEK IMMEDIATE MEDICAL CARE IF:   You have a fever.  You develop severe or persistent headache, ear pain, sinus pain, or chest pain.  You develop wheezing, a prolonged cough, cough up blood, or have a change in your usual mucus (if you have chronic lung disease).  You develop sore muscles or a stiff neck. Document Released: 10/14/2000 Document Revised: 07/13/2011 Document Reviewed: 08/22/2010 [facility] Patient Information 2014 [place], [place].

## 2013-07-08 NOTE — ED Notes (Signed)
C/o left ear pain.  Onset this am of pain.  Patient reports having cough, cold, runny nose all week, saw physician on Monday.  Told symptoms were viral and instructed to take otc medications

## 2013-07-08 NOTE — ED Provider Notes (Signed)
Medical screening examination/treatment/procedure(s) were performed by resident physician or non-physician practitioner and as supervising physician I was immediately available for consultation/collaboration.   Kimisha Eunice DOUGLAS MD.   Trev Boley D Tamarah Bhullar, MD 07/08/13 1451 

## 2013-07-08 NOTE — ED Provider Notes (Signed)
CSN: [id number]     Arrival date & time 07/08/13  4098 History   First MD Initiated Contact with Patient 07/08/13 1040     Chief Complaint  Patient presents with  . Otalgia   (Consider location/radiation/quality/duration/timing/severity/associated sxs/prior Treatment) HPI Comments: C/O left earache since this AM One week ago developed URI and evaluated by her PCP, dx with URI   Past Medical History  Diagnosis Date  . 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 Gestational diabetes   . History of chlamydia   . H/O varicella    Past Surgical History  Procedure Laterality Date  . Wisdom tooth extraction    . Cholestectomy    . Leep    . Invitrofert    . Invitrofertilization    . Colposcopy     Family History  Problem Relation Age of Onset  . Hypertension Mother   . Alcohol abuse Father   . Diabetes Brother   . Hypertension Maternal Aunt   . Heart disease Maternal Grandmother   . Hypertension Maternal Grandmother   . Diabetes Maternal Grandmother   . Kidney disease Maternal Grandmother    History  Substance Use Topics  . Smoking status: Never Smoker   . Smokeless tobacco: Never Used  . Alcohol Use: No   OB History   Grav Para Term Preterm Abortions TAB SAB Ect Mult Living   1 1 1  0 0 0 0 0 0 1     Review of Systems  Constitutional: Positive for activity change. Negative for fever, chills, appetite change and fatigue.  HENT: Positive for congestion, postnasal drip, rhinorrhea and sore throat. Negative for facial swelling.   Eyes: Negative.   Respiratory: Positive for cough.   Cardiovascular: Negative.   Gastrointestinal: Negative.   Musculoskeletal: Negative for neck pain and neck stiffness.  Skin: Negative for pallor and rash.  Neurological: Negative.     Allergies  Review of patient's allergies indicates no known allergies.  Home Medications   Current Outpatient Rx  Name  Route  Sig  Dispense  Refill  . antipyrine-benzocaine (AURALGAN) otic solution   Left Ear  Place 3-4 drops into the left ear every 2 (two) hours as needed for ear pain.   10 mL   0   . calcium carbonate-magnesium hydroxide (ROLAIDS) 334 MG CHEW   Oral   Chew 1 tablet by mouth 2 (two) times daily with a meal. For indigestion         . cefdinir (OMNICEF) 300 MG capsule   Oral   Take 1 capsule (300 mg total) by mouth 2 (two) times daily.   20 capsule   0   . HYDROcodone-acetaminophen (NORCO/VICODIN) 5-325 MG per tablet   Oral   Take 1 tablet by mouth every 4 (four) hours as needed.   15 tablet   0   . Prenatal Vit-Fe Fumarate-FA (PRENATAL MULTIVITAMIN) TABS   Oral   Take 1 tablet by mouth daily.          BP 153/90  Pulse 86  Temp(Src) 98.3 F (36.8 C) (Oral)  Resp 18  SpO2 98%  LMP 06/24/2013 Physical Exam  Nursing note and vitals reviewed. Constitutional: She is oriented to person, place, and time. She appears well-developed and well-nourished. No distress.  HENT:  R TM nl L TM with anterior erythema and central pallor. OP mild erythema and clear PND  Neck: Normal range of motion. Neck supple.  Cardiovascular: Normal rate and regular rhythm.   Pulmonary/Chest:  Effort normal and breath sounds normal. No respiratory distress. She has no rales.  Musculoskeletal: Normal range of motion. She exhibits no edema.  Lymphadenopathy:    She has no cervical adenopathy.  Neurological: She is alert and oriented to person, place, and time.  Skin: Skin is warm and dry. No rash noted.  Psychiatric: She has a normal mood and affect.    ED Course  Procedures (including critical care time) Labs Review Labs Reviewed - No data to display Imaging Review No results found.   MDM   1. Left otitis media   2. URI (upper respiratory infection)    Omnicef bid Auralgan L ear Norco 5 mg for pain #15 Alka Seltzer Cold Night med for URI      Hayden Rasmussenavid Shanon Seawright, NP 07/08/13 1059

## 2014-03-05 ENCOUNTER — Encounter (HOSPITAL_COMMUNITY): Payer: Self-pay | Admitting: Emergency Medicine

## 2014-04-07 ENCOUNTER — Encounter (HOSPITAL_COMMUNITY): Payer: Self-pay | Admitting: *Deleted

## 2014-04-07 ENCOUNTER — Emergency Department (HOSPITAL_COMMUNITY)
Admission: EM | Admit: 2014-04-07 | Discharge: 2014-04-07 | Disposition: A | Discharge status: Home / Self Care | Payer: BC Managed Care – PPO | Attending: Emergency Medicine | Admitting: Emergency Medicine

## 2014-04-07 DIAGNOSIS — Z8619 Personal history of other infectious and parasitic diseases: Secondary | ICD-10-CM | POA: Diagnosis not present

## 2014-04-07 DIAGNOSIS — Z8632 Personal history of gestational diabetes: Secondary | ICD-10-CM | POA: Diagnosis not present

## 2014-04-07 DIAGNOSIS — Z8742 Personal history of other diseases of the female genital tract: Secondary | ICD-10-CM | POA: Diagnosis not present

## 2014-04-07 DIAGNOSIS — M545 Low back pain, unspecified: Secondary | ICD-10-CM

## 2014-04-07 DIAGNOSIS — Z79899 Other long term (current) drug therapy: Secondary | ICD-10-CM | POA: Diagnosis not present

## 2014-04-07 MED ORDER — HYDROCODONE-ACETAMINOPHEN 5-325 MG PO TABS: 1.0000 | ORAL_TABLET | ORAL | Status: DC | PRN

## 2014-04-07 MED ORDER — HYDROCODONE-ACETAMINOPHEN 5-325 MG PO TABS
1.0000 | ORAL_TABLET | Freq: Once | ORAL | Status: AC
  Administered 2014-04-07: 1 via ORAL

## 2014-04-07 MED ORDER — PREDNISONE 10 MG PO TABS: ORAL_TABLET | ORAL | Status: DC

## 2014-04-07 MED ORDER — PREDNISONE 20 MG PO TABS
60.0000 mg | ORAL_TABLET | Freq: Once | ORAL | Status: AC
  Administered 2014-04-07: 60 mg via ORAL

## 2014-04-07 NOTE — ED Notes (Signed)
Declined W/C at D/C and was escorted to lobby by RN. 

## 2014-04-07 NOTE — ED Notes (Addendum)
Pt reports back pain started on Friday , unknown cause. Pain radiates down front of legs. Pt has tried OTC without relkef.

## 2014-04-07 NOTE — Discharge Instructions (Signed)
Back Pain, Adult Low back pain is very common. About 1 in 5 people have back pain.The cause of low back pain is rarely dangerous. The pain often gets better over time.About half of people with a sudden onset of back pain feel better in just 2 weeks. About 8 in 10 people feel better by 6 weeks.  CAUSES Some common causes of back pain include:  Strain of the muscles or ligaments supporting the spine.  Wear and tear (degeneration) of the spinal discs.  Arthritis.  Direct injury to the back. DIAGNOSIS Most of the time, the direct cause of low back pain is not known.However, back pain can be treated effectively even when the exact cause of the pain is unknown.Answering your caregiver's questions about your overall health and symptoms is one of the most accurate ways to make sure the cause of your pain is not dangerous. If your caregiver needs more information, he or she may order lab work or imaging tests (X-rays or MRIs).However, even if imaging tests show changes in your back, this usually does not require surgery. HOME CARE INSTRUCTIONS For many people, back pain returns.Since low back pain is rarely dangerous, it is often a condition that people can learn to manageon their own.   Remain active. It is stressful on the back to sit or stand in one place. Do not sit, drive, or stand in one place for more than 30 minutes at a time. Take short walks on level surfaces as soon as pain allows.Try to increase the length of time you walk each day.  Do not stay in bed.Resting more than 1 or 2 days can delay your recovery.  Do not avoid exercise or work.Your body is made to move.It is not dangerous to be active, even though your back may hurt.Your back will likely heal faster if you return to being active before your pain is gone.  Pay attention to your body when you bend and lift. Many people have less discomfortwhen lifting if they bend their knees, keep the load close to their bodies,and  avoid twisting. Often, the most comfortable positions are those that put less stress on your recovering back.  Find a comfortable position to sleep. Use a firm mattress and lie on your side with your knees slightly bent. If you lie on your back, put a pillow under your knees.  Only take over-the-counter or prescription medicines as directed by your caregiver. Over-the-counter medicines to reduce pain and inflammation are often the most helpful.Your caregiver may prescribe muscle relaxant drugs.These medicines help dull your pain so you can more quickly return to your normal activities and healthy exercise.  Put ice on the injured area.  Put ice in a plastic bag.  Place a towel between your skin and the bag.  Leave the ice on for 15-20 minutes, 03-04 times a day for the first 2 to 3 days. After that, ice and heat may be alternated to reduce pain and spasms.  Ask your caregiver about trying back exercises and gentle massage. This may be of some benefit.  Avoid feeling anxious or stressed.Stress increases muscle tension and can worsen back pain.It is important to recognize when you are anxious or stressed and learn ways to manage it.Exercise is a great option. SEEK MEDICAL CARE IF:  You have pain that is not relieved with rest or medicine.  You have pain that does not improve in 1 week.  You have new symptoms.  You are generally not feeling well. SEEK   IMMEDIATE MEDICAL CARE IF:   You have pain that radiates from your back into your legs.  You develop new bowel or bladder control problems.  You have unusual weakness or numbness in your arms or legs.  You develop nausea or vomiting.  You develop abdominal pain.  You feel faint. Document Released: 04/20/2005 Document Revised: 10/20/2011 Document Reviewed: 08/22/2013 [employer] Patient Information 2015 [employer]. This information is not intended to replace advice given to you by your health care provider. Make sure you  discuss any questions you have with your health care provider.  

## 2014-04-07 NOTE — ED Provider Notes (Signed)
CSN: [id number]     Arrival date & time 04/07/14  0831 History  This chart was scribed for Elpidio AnisShari Yaritsa Savarino, PA-C, working with Flint MelterElliott L Wentz, MD by Chestine SporeSoijett Blue, ED Scribe. The patient was seen in room TR09C/TR09C at 9:08 AM.    Chief Complaint  Patient presents with  . Back Pain    The history is provided by the patient. No language interpreter was used.   HPI Comments: Jennifer Mccormick is a 33 y.o. female who presents to the Emergency Department complaining of low back pain onset yesterday morning. It radiates down the front of her legs.  She denies any injury to the area. It was hard to walk this morning because of the pain from her back. The pain is constant but it is worsened with movement. When her back hurts the pain is alleviated with sitting still. She states that she has tried a patch with no relief for her symptoms. She denies abdominal pain, weakness, dysuria, frequency, fever, n/v, and any other symptoms. She denies DM. She just finished a ten day course of abx for tonsillitis and she acquired a yeast infection from it. There was bilateral back pain from that and she voiced concern for if it began with that.   Past Medical History  Diagnosis Date  . 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 Gestational diabetes   . History of chlamydia   . H/O varicella    Past Surgical History  Procedure Laterality Date  . Wisdom tooth extraction    . Cholestectomy    . Leep    . Invitrofert    . Invitrofertilization    . Colposcopy    . Cholecystectomy     Family History  Problem Relation Age of Onset  . Hypertension Mother   . Alcohol abuse Father   . Diabetes Brother   . Hypertension Maternal Aunt   . Heart disease Maternal Grandmother   . Hypertension Maternal Grandmother   . Diabetes Maternal Grandmother   . Kidney disease Maternal Grandmother    History  Substance Use Topics  . Smoking status: Never Smoker   . Smokeless tobacco: Never Used  . Alcohol Use: No   OB History    Gravida Para Term Preterm AB TAB SAB Ectopic Multiple Living   1 1 1  0 0 0 0 0 0 1     Review of Systems  Constitutional: Negative for fever.  Gastrointestinal: Negative for nausea, vomiting and abdominal pain.  Genitourinary: Negative for enuresis.  Musculoskeletal: Positive for back pain.  Neurological: Negative for weakness and numbness.      Allergies  Review of patient's allergies indicates no known allergies.  Home Medications   Prior to Admission medications   Medication Sig Start Date End Date Taking? Authorizing Provider  antipyrine-benzocaine Lyla Son(AURALGAN) otic solution Place 3-4 drops into the left ear every 2 (two) hours as needed for ear pain. 07/08/13   Hayden Rasmussenavid Mabe, NP  calcium carbonate-magnesium hydroxide (ROLAIDS) 334 MG CHEW Chew 1 tablet by mouth 2 (two) times daily with a meal. For indigestion    Historical Provider, MD  cefdinir (OMNICEF) 300 MG capsule Take 1 capsule (300 mg total) by mouth 2 (two) times daily. 07/08/13   Hayden Rasmussenavid Mabe, NP  HYDROcodone-acetaminophen (NORCO/VICODIN) 5-325 MG per tablet Take 1 tablet by mouth every 4 (four) hours as needed. 07/08/13   Hayden Rasmussenavid Mabe, NP  Prenatal Vit-Fe Fumarate-FA (PRENATAL MULTIVITAMIN) TABS Take 1 tablet by mouth daily.    Historical  Provider, MD   BP 140/80 mmHg  Pulse 72  Temp(Src) 97.3 F (36.3 C) (Oral)  Resp 20  Ht 5\' 4"  (1.626 m)  Wt 250 lb (113.399 kg)  BMI 42.89 kg/m2  SpO2 100%  LMP 04/07/2014  Breastfeeding? No  Physical Exam  Constitutional: She is oriented to person, place, and time. She appears well-developed and well-nourished. No distress.  HENT:  Head: Normocephalic and atraumatic.  Eyes: EOM are normal.  Neck: Neck supple. No tracheal deviation present.  Cardiovascular: Normal rate and intact distal pulses.   Pulmonary/Chest: Effort normal. No respiratory distress.  Abdominal: Soft. There is no tenderness.  Musculoskeletal: Normal range of motion.  Right para-lumbar tenderness without swelling.  No swelling, equal reflexes of the lower extremities. Distal pulses intact.   Neurological: She is alert and oriented to person, place, and time. She has normal reflexes. Coordination normal.  Skin: Skin is warm and dry.  Psychiatric: She has a normal mood and affect. Her behavior is normal.  Nursing note and vitals reviewed.   ED Course  Procedures (including critical care time) DIAGNOSTIC STUDIES: Oxygen Saturation is 100% on room air, normal by my interpretation.    COORDINATION OF CARE: 9:17 AM-Discussed treatment plan which includes prednisone, pain medication PRN, F/U with orthopedic or PCP if the pain persists with pt at bedside and pt agreed to plan.   Labs Review Labs Reviewed - No data to display  Imaging Review No results found.   EKG Interpretation None      MDM   Final diagnoses:  None    1. Low back pain  DDx: muscular strain vs lumbar radiculopathy. Discussed each with the patient and treatment. There are no neurologic deficits on exam today. Referral to ortho provided. She is appropriate for discharge.   I personally performed the services described in this documentation, which was scribed in my presence. The recorded information has been reviewed and is accurate.    Arnoldo HookerShari A Jonquil Stubbe, PA-C 04/07/14 2306  Flint MelterElliott L Wentz, MD 04/08/14 [phone]

## 2014-06-14 ENCOUNTER — Other Ambulatory Visit: Payer: Self-pay | Admitting: Obstetrics and Gynecology

## 2014-06-15 LAB — CYTOLOGY - PAP

## 2014-07-03 ENCOUNTER — Other Ambulatory Visit: Payer: Self-pay | Admitting: Obstetrics and Gynecology

## 2014-11-13 ENCOUNTER — Other Ambulatory Visit: Payer: Self-pay | Admitting: Obstetrics and Gynecology

## 2015-04-17 ENCOUNTER — Other Ambulatory Visit: Payer: Self-pay | Admitting: Obstetrics and Gynecology

## 2015-04-18 LAB — CYTOLOGY - PAP

## 2015-04-23 ENCOUNTER — Other Ambulatory Visit: Payer: Self-pay | Admitting: Specialist

## 2015-04-26 ENCOUNTER — Ambulatory Visit
Admission: RE | Admit: 2015-04-26 | Discharge: 2015-04-26 | Disposition: A | Discharge status: Home / Self Care | Payer: Self-pay | Source: Ambulatory Visit | Attending: Specialist | Admitting: Specialist

## 2015-05-05 HISTORY — PX: LAPAROSCOPIC GASTRIC BAND REMOVAL WITH LAPAROSCOPIC GASTRIC SLEEVE RESECTION: SHX6498

## 2015-05-13 ENCOUNTER — Encounter (HOSPITAL_COMMUNITY): Payer: Self-pay

## 2015-05-13 ENCOUNTER — Emergency Department (HOSPITAL_COMMUNITY)
Admission: EM | Admit: 2015-05-13 | Discharge: 2015-05-13 | Disposition: A | Discharge status: Home / Self Care | Payer: Worker's Compensation | Attending: Emergency Medicine | Admitting: Emergency Medicine

## 2015-05-13 DIAGNOSIS — Z7721 Contact with and (suspected) exposure to potentially hazardous body fluids: Secondary | ICD-10-CM | POA: Insufficient documentation

## 2015-05-13 DIAGNOSIS — Z8632 Personal history of gestational diabetes: Secondary | ICD-10-CM | POA: Diagnosis not present

## 2015-05-13 DIAGNOSIS — Z79899 Other long term (current) drug therapy: Secondary | ICD-10-CM | POA: Insufficient documentation

## 2015-05-13 DIAGNOSIS — Z8619 Personal history of other infectious and parasitic diseases: Secondary | ICD-10-CM | POA: Diagnosis not present

## 2015-05-13 MED ORDER — POLYMYXIN B-TRIMETHOPRIM 10000-0.1 UNIT/ML-% OP SOLN
1.0000 [drp] | OPHTHALMIC | Status: DC
  Administered 2015-05-13: 1 [drp] via OPHTHALMIC
  Filled 2015-05-13: qty 10

## 2015-05-13 NOTE — Discharge Instructions (Signed)
Body Fluid Exposure Information  People may come into contact with blood and other body fluids under various circumstances. In some cases, body fluids may contain germs (bacteria or viruses) that cause infections. These germs can be spread when another person's body fluids come into contact with your skin, mouth, eyes, or genitals.   Exposure to body fluids that may contain infectious material is a common problem for people providing care for others who are ill. It can occur when a person is performing health care tasks in the workplace or when taking care of a family member at home. Other common methods of exposure include injection drug use, sharing needles, and sexual activity.  The risk of an infection spreading through body fluid exposure is small and depends on a variety of factors. This includes the type of body fluid, the nature of the exposure, and the health status of the person who was the source of the body fluids. Your health care provider can help you assess the risk.  WHAT TYPES OF BODY FLUID CAN SPREAD INFECTION?  The following types of body fluid have the potential to spread infections:   Blood.   Semen.   Vaginal secretions.   Urine.   Feces.   Saliva.   Nasal or eye discharge.   Breast milk.   Amniotic fluid and fluids surrounding body organs.  WHAT ARE SOME FIRST-AID MEASURES FOR BODY FLUID EXPOSURE?  The following steps should be taken as soon as possible after a person is exposed to body fluids:  Intact Skin   For contact with closed skin, wash the area with soap and water.  Broken Skin   For contact with broken skin (a wound), wash the area with soap and water. Let the area bleed a little. Then place a bandage or clean towel on the wound, applying gentle pressure to stop the bleeding. Do not squeeze or rub the area.   Use just water or hand sanitizer if a sink with soap is not available.   Do not use harsh chemicals such as bleach or iodine.  Eyes   Rinse the eyes with water or  saline for 30 seconds.   If the person is wearing contact lenses, leave the contact lenses in while rinsing the eyes. Once the rinsing is complete, remove the contact lenses.  Mouth   Spit out the fluids. Rinse and spit with water 4-5 times.  In addition, you should remove any clothing that comes into contact with body fluids. However, if body fluid exposure results from sexual assault, seek medical care immediately without changing clothes or bathing.  WHEN SHOULD YOU SEEK HELP?  After performing the proper first-aid steps, you should contact your health care provider or seek emergency care right away if blood or other body fluids made contact with areas of broken skin or openings such as the eyes or mouth. If the exposure to body fluid happened in the workplace, you should report it to your work supervisor immediately. Many workplaces have procedures in place for exposure situations.  WHAT WILL HAPPEN AFTER YOU REPORT THE EXPOSURE?  Your health care provider will ask you several questions. Information requested may include:   Your medical history, including vaccination records.   Date and time of the exposure.   Whether you saw body fluids during the exposure.   Type of body fluid you were exposed to.   Volume of body fluid you were exposed to.   How the exposure happened.   If any devices,   such as a needle, were being used.   Which area of your body made contact with the body fluid.   Description of any injury to the skin or other area.   How long contact was made with the body fluid.   Any information you have about the health status of the person whose body fluid you were exposed to.  The health care provider will assess your risk of infection. Often, no treatment is necessary. In some cases, the health care provider may recommend doing blood tests right away. Follow-up blood tests may also be done at certain intervals during the upcoming weeks and months to check for changes. You may be offered  treatment to prevent an infection from developing after exposure (post-exposure prophylaxis). This may include certain vaccinations or medicines and may be necessary when there is a risk of a serious infection, such as HIV or hepatitis B. Your health care provider should discuss appropriate treatment and vaccinations with you.  HOW CAN YOU PREVENT EXPOSURE AND INFECTION?  Always remember that prevention is the first line of defense against body fluid exposure. To help prevent exposure to body fluids:   Wash and disinfect countertops and other surfaces regularly.   Wear appropriate protective gear such as gloves, gowns, or eyewear when the possibility of exposure is present.   Wipe away spills of body fluid with disposable towels.   Properly dispose of blood products and other fluids. Use secured bags.   Properly dispose of needles and other instruments with sharp points or edges (sharps). Use closed, marked containers.   Avoid injection drug use.   Do not share needles.   Avoid recapping needles.   Use a condom during sexual intercourse.   Make sure you learn and follow any guidelines for preventing exposure (universal precautions) provided at your workplace.  To help reduce your chances of getting an infection:   Make sure your vaccinations are up-to-date, including those for tetanus and hepatitis.   Wash your hands frequently with soap and water. Use hand sanitizers.   Avoid having multiple sex partners.   Follow up with your health care provider as directed after being evaluated for an exposure to body fluids.  To avoid spreading infection to others:   Do not have sexual relations until you know you are free of infection.   Do not donate blood, plasma, breast milk, sperm, or other body fluids.   Do not share hygiene tools such as toothbrushes, razors, or dental floss.   Keep open wounds covered.   Dispose of any items with blood on them (razors, tampons, bandages) by putting them in the  trash.   Do not share drug supplies with others, such as needles, syringes, straws, or pipes.   Follow all of your health care provider's instructions for preventing the spread of infection.     This information is not intended to replace advice given to you by your health care provider. Make sure you discuss any questions you have with your health care provider.     Document Released: 12/21/2012 Document Revised: 04/25/2013 Document Reviewed: 12/21/2012  [employer]vier Interactive Patient Education 2016 [employer]

## 2015-05-13 NOTE — ED Provider Notes (Signed)
CSN: [id number]     Arrival date & time 05/13/15  0149 History   First MD Initiated Contact with Patient 05/13/15 0207     Chief Complaint  Patient presents with  . Foreign Body in Eye     (Consider location/radiation/quality/duration/timing/severity/associated sxs/prior Treatment) HPI Comments: Patient presents to the emergency department with body fluid exposure. 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  Patient is a 35 y.o. female presenting with foreign body in eye.  Foreign Body in Eye    Past Medical History  Diagnosis Date  . 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 Gestational diabetes   . History of chlamydia   . H/O varicella    Past Surgical History  Procedure Laterality Date  . Wisdom tooth extraction    . Cholestectomy    . Leep    . Invitrofert    . Invitrofertilization    . Colposcopy    . Cholecystectomy     Family History  Problem Relation Age of Onset  . Hypertension Mother   . Alcohol abuse Father   . Diabetes Brother   . Hypertension Maternal Aunt   . Heart disease Maternal Grandmother   . Hypertension Maternal Grandmother   . Diabetes Maternal Grandmother   . Kidney disease Maternal Grandmother    Social History  Substance Use Topics  . Smoking status: Never Smoker   . Smokeless tobacco: Never Used  . Alcohol Use: No   OB History    Gravida Para Term Preterm AB TAB SAB Ectopic Multiple Living   1 1 1  0 0 0 0 0 0 1     Review of Systems  Eyes: Negative for pain, discharge and itching.  All other systems reviewed and are negative.     Allergies  Review of patient's allergies indicates no known allergies.  Home Medications   Prior to Admission medications   Medication Sig Start Date End Date Taking? Authorizing Provider  antipyrine-benzocaine Lyla Son) otic solution Place 3-4 drops into the left ear every 2 (two) hours as needed for ear pain. 07/08/13    Hayden Rasmussen, NP  calcium carbonate-magnesium hydroxide (ROLAIDS) 334 MG CHEW Chew 1 tablet by mouth 2 (two) times daily with a meal. For indigestion    Historical Provider, MD  cefdinir (OMNICEF) 300 MG capsule Take 1 capsule (300 mg total) by mouth 2 (two) times daily. Patient not taking: Reported on 04/07/2014 07/08/13   Hayden Rasmussen, NP  HYDROcodone-acetaminophen (NORCO/VICODIN) 5-325 MG per tablet Take 1-2 tablets by mouth every 4 (four) hours as needed. 04/07/14   Elpidio Anis, PA-C  ibuprofen (ADVIL,MOTRIN) 200 MG tablet Take 200 mg by mouth every 6 (six) hours as needed.    Historical Provider, MD  naproxen sodium (ANAPROX) 220 MG tablet Take 220 mg by mouth 2 (two) times daily with a meal.    Historical Provider, MD  predniSONE (DELTASONE) 10 MG tablet Take 6 tablets day 2 (day one given in ED) Take 5 tablets days 3 and 4 Take 4 tablets days 5 and 6 Take 3 tablets days 7 and 8 Take 2 tablets days 9 and 10 Take 1 tablet days 11 and 12 04/07/14   Elpidio Anis, PA-C  Prenatal Vit-Fe Fumarate-FA (PRENATAL MULTIVITAMIN) TABS Take 1 tablet by mouth daily.    Historical Provider, MD   BP 152/91 mmHg  Pulse 73  Temp(Src) 98.1 F (36.7 C) (Oral)  Resp 16  Ht 5\' 4"  (1.626 m)  Wt 250 lb (113.399 kg)  BMI 42.89 kg/m2  SpO2 100%  LMP 04/23/2015 Physical Exam  Constitutional: She is oriented to person, place, and time. She appears well-developed and well-nourished. No distress.  HENT:  Head: Normocephalic and atraumatic.  Right Ear: Hearing normal.  Left Ear: Hearing normal.  Nose: Nose normal.  Mouth/Throat: Oropharynx is clear and moist and mucous membranes are normal.  Eyes: Conjunctivae and EOM are normal. Pupils are equal, round, and reactive to light.  Neck: Normal range of motion. Neck supple.  Cardiovascular: Regular rhythm, S1 normal and S2 normal.  Exam reveals no gallop and no friction rub.   No murmur heard. Pulmonary/Chest: Effort normal and breath sounds normal. No respiratory  distress. She exhibits no tenderness.  Abdominal: Soft. Normal appearance and bowel sounds are normal. There is no hepatosplenomegaly. There is no tenderness. There is no rebound, no guarding, no tenderness at McBurney's point and negative Murphy's sign. No hernia.  Musculoskeletal: Normal range of motion.  Neurological: She is alert and oriented to person, place, and time. She has normal strength. No cranial nerve deficit or sensory deficit. Coordination normal. GCS eye subscore is 4. GCS verbal subscore is 5. GCS motor subscore is 6.  Skin: Skin is warm, dry and intact. No rash noted. No cyanosis.  Psychiatric: She has a normal mood and affect. Her speech is normal and behavior is normal. Thought content normal.  Nursing note and vitals reviewed.   ED Course  Procedures (including critical care time) Labs Review Labs Reviewed - No data to display  Imaging Review No results found. I have personally reviewed and evaluated these images and lab results as part of my medical decision-making.   EKG Interpretation None      MDM   Final diagnoses:  None   body fluid exposure  Patient presents to the emergency department because of body fluid exposure. 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 Patient reports that her hepatitis B immunization is up-to-date. Had a lengthy conversation with the patient. Source patient is not known to have HIV, is felt to be low risk. This is an exceptionally low risk exposure. I do not have access to the source patient to perform rapid HIV testing. Patient has decided not to have postexposure prophylaxis after discussing risks and benefits. She is, however, concerned about the possibility of simple bacterial infection transmission. Will place her on Polytrim drops empirically.    Gilda Creasehristopher J Jocee Kissick, MD 05/13/15 [phone]

## 2015-05-13 NOTE — ED Notes (Signed)
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

## 2015-05-20 ENCOUNTER — Ambulatory Visit
Admission: RE | Admit: 2015-05-20 | Discharge: 2015-05-20 | Disposition: A | Discharge status: Home / Self Care | Payer: BLUE CROSS/BLUE SHIELD | Source: Ambulatory Visit | Attending: Specialist | Admitting: Specialist

## 2015-11-26 ENCOUNTER — Other Ambulatory Visit: Payer: Self-pay | Admitting: Obstetrics and Gynecology

## 2015-11-26 DIAGNOSIS — Z6835 Body mass index (BMI) 35.0-35.9, adult: Secondary | ICD-10-CM | POA: Diagnosis not present

## 2015-11-26 DIAGNOSIS — Z01419 Encounter for gynecological examination (general) (routine) without abnormal findings: Secondary | ICD-10-CM | POA: Diagnosis not present

## 2015-11-26 DIAGNOSIS — Z113 Encounter for screening for infections with a predominantly sexual mode of transmission: Secondary | ICD-10-CM | POA: Diagnosis not present

## 2015-11-26 DIAGNOSIS — Z124 Encounter for screening for malignant neoplasm of cervix: Secondary | ICD-10-CM | POA: Diagnosis not present

## 2015-11-27 LAB — CYTOLOGY - PAP

## 2016-01-07 MED FILL — ALPRAZolam 0.5 MG TABS: 0.5 | 15 days supply | Qty: 30 | Fill #0

## 2016-01-08 DIAGNOSIS — N938 Other specified abnormal uterine and vaginal bleeding: Secondary | ICD-10-CM | POA: Diagnosis not present

## 2016-01-08 DIAGNOSIS — K912 Postsurgical malabsorption, not elsewhere classified: Secondary | ICD-10-CM | POA: Diagnosis not present

## 2016-01-08 DIAGNOSIS — Z5181 Encounter for therapeutic drug level monitoring: Secondary | ICD-10-CM | POA: Diagnosis not present

## 2016-01-08 DIAGNOSIS — N921 Excessive and frequent menstruation with irregular cycle: Secondary | ICD-10-CM | POA: Diagnosis not present

## 2016-01-08 DIAGNOSIS — Z9884 Bariatric surgery status: Secondary | ICD-10-CM | POA: Diagnosis not present

## 2016-01-08 MED FILL — LO LOESTRIN FE 1-10 TABLET: 1 MG-10 MCG | 84 days supply | Qty: 84 | Fill #0

## 2016-01-28 DIAGNOSIS — Z5181 Encounter for therapeutic drug level monitoring: Secondary | ICD-10-CM | POA: Diagnosis not present

## 2016-01-28 DIAGNOSIS — K912 Postsurgical malabsorption, not elsewhere classified: Secondary | ICD-10-CM | POA: Diagnosis not present

## 2016-01-28 DIAGNOSIS — Z9884 Bariatric surgery status: Secondary | ICD-10-CM | POA: Diagnosis not present

## 2016-02-12 MED FILL — ALPRAZolam 0.5 MG TABS: 0.5 | 15 days supply | Qty: 30 | Fill #1

## 2016-04-14 MED FILL — LO LOESTRIN FE 1-10 TABLET: 1 MG-10 MCG | 84 days supply | Qty: 84 | Fill #1

## 2016-05-06 DIAGNOSIS — Z3141 Encounter for fertility testing: Secondary | ICD-10-CM | POA: Diagnosis not present

## 2016-05-06 DIAGNOSIS — Z119 Encounter for screening for infectious and parasitic diseases, unspecified: Secondary | ICD-10-CM | POA: Diagnosis not present

## 2016-05-06 DIAGNOSIS — N979 Female infertility, unspecified: Secondary | ICD-10-CM | POA: Diagnosis not present

## 2016-05-06 MED FILL — NORETHIN-ESTRAD-FERR 1-0.02: 1-20 | 84 days supply | Qty: 84 | Fill #0

## 2016-05-07 MED FILL — ALPRAZolam 0.5 MG TABS: 0.5 | 15 days supply | Qty: 30 | Fill #2

## 2016-05-19 DIAGNOSIS — Z3141 Encounter for fertility testing: Secondary | ICD-10-CM | POA: Diagnosis not present

## 2016-06-09 MED FILL — METHYLPREDNISOLONE 4 MG TAB: 4 | 4 days supply | Qty: 16 | Fill #0

## 2016-06-09 MED FILL — MENOPUR 75 UNIT VIAL: 75 | 25 days supply | Qty: 25 | Fill #0

## 2016-06-09 MED FILL — CETROTIDE 0.25 MG KIT: 0.25 | 5 days supply | Qty: 5 | Fill #0

## 2016-06-09 MED FILL — BD NEEDLES 30GX0.5": 30G X 1/2" | 30 days supply | Qty: 30 | Fill #0

## 2016-06-09 MED FILL — GONAL-F RFF REDI-JECT 300 U: 300 | 1 days supply | Qty: 1 | Fill #0

## 2016-06-09 MED FILL — BD NEEDLES 22GX1.5: 22G X 1-1/2 | 30 days supply | Qty: 30 | Fill #0

## 2016-06-09 MED FILL — GONAL-F RFF REDI-JECT 900 U: 900 | 3 days supply | Qty: 5 | Fill #0

## 2016-06-09 MED FILL — BD 3 ML SYRINGE 18GX1-1/2": 18G X 1-1/2 | 25 days supply | Qty: 50 | Fill #0

## 2016-06-09 MED FILL — BD NEEDLES 30GX0.5: 30G X 1/2" | 30 days supply | Qty: 30 | Fill #0

## 2016-06-09 MED FILL — BD 3 ML SYRINGE 18GX1-1/2: 18G X 1-1/2 | 25 days supply | Qty: 50 | Fill #0

## 2016-06-09 MED FILL — PREGNYL 10,000 UNITS VIAL: 10000 | 1 days supply | Qty: 1 | Fill #0

## 2016-06-09 MED FILL — BD NEEDLES 22GX1.5": 22G X 1-1/2 | 30 days supply | Qty: 30 | Fill #0

## 2016-06-19 DIAGNOSIS — Z3141 Encounter for fertility testing: Secondary | ICD-10-CM | POA: Diagnosis not present

## 2016-06-22 MED FILL — ESTRADIOL 0.1 MG PATCH: 0.1 | 30 days supply | Qty: 8 | Fill #0

## 2016-06-22 MED FILL — ALPRAZolam 0.5 MG TABS: 0.5 | 15 days supply | Qty: 30 | Fill #3

## 2016-06-22 MED FILL — PREGNYL 10,000 UNITS VIAL: 10000 | 30 days supply | Qty: 1 | Fill #0

## 2016-07-06 DIAGNOSIS — Z3141 Encounter for fertility testing: Secondary | ICD-10-CM | POA: Diagnosis not present

## 2016-07-10 DIAGNOSIS — N979 Female infertility, unspecified: Secondary | ICD-10-CM | POA: Diagnosis not present

## 2016-07-13 DIAGNOSIS — Z3141 Encounter for fertility testing: Secondary | ICD-10-CM | POA: Diagnosis not present

## 2016-07-13 DIAGNOSIS — N979 Female infertility, unspecified: Secondary | ICD-10-CM | POA: Diagnosis not present

## 2016-07-15 DIAGNOSIS — Z3141 Encounter for fertility testing: Secondary | ICD-10-CM | POA: Diagnosis not present

## 2016-07-18 DIAGNOSIS — Z3189 Encounter for other procreative management: Secondary | ICD-10-CM | POA: Diagnosis not present

## 2016-07-18 DIAGNOSIS — Z3183 Encounter for assisted reproductive fertility procedure cycle: Secondary | ICD-10-CM | POA: Diagnosis not present

## 2016-07-18 DIAGNOSIS — N979 Female infertility, unspecified: Secondary | ICD-10-CM | POA: Diagnosis not present

## 2016-07-18 DIAGNOSIS — Z3141 Encounter for fertility testing: Secondary | ICD-10-CM | POA: Diagnosis not present

## 2016-07-23 DIAGNOSIS — Z3183 Encounter for assisted reproductive fertility procedure cycle: Secondary | ICD-10-CM | POA: Diagnosis not present

## 2016-07-31 DIAGNOSIS — Z3200 Encounter for pregnancy test, result unknown: Secondary | ICD-10-CM | POA: Diagnosis not present

## 2016-08-04 DIAGNOSIS — Z3200 Encounter for pregnancy test, result unknown: Secondary | ICD-10-CM | POA: Diagnosis not present

## 2016-08-06 MED FILL — PROGESTERONE OIL 50 MG/ML V: 50 | 10 days supply | Qty: 10 | Fill #0

## 2016-08-19 MED FILL — PROGESTERONE OIL 50 MG/ML V: 50 | 10 days supply | Qty: 10 | Fill #1

## 2016-08-20 DIAGNOSIS — O0901 Supervision of pregnancy with history of infertility, first trimester: Secondary | ICD-10-CM | POA: Diagnosis not present

## 2016-08-20 DIAGNOSIS — Z3A00 Weeks of gestation of pregnancy not specified: Secondary | ICD-10-CM | POA: Diagnosis not present

## 2016-08-31 MED FILL — PROGESTERONE OIL 50 MG/ML V: 50 | 10 days supply | Qty: 10 | Fill #2

## 2016-09-03 DIAGNOSIS — O0901 Supervision of pregnancy with history of infertility, first trimester: Secondary | ICD-10-CM | POA: Diagnosis not present

## 2016-09-03 DIAGNOSIS — Z3A00 Weeks of gestation of pregnancy not specified: Secondary | ICD-10-CM | POA: Diagnosis not present

## 2016-09-16 ENCOUNTER — Other Ambulatory Visit: Payer: Self-pay | Admitting: Obstetrics and Gynecology

## 2016-09-16 DIAGNOSIS — O3680X2 Pregnancy with inconclusive fetal viability, fetus 2: Secondary | ICD-10-CM | POA: Diagnosis not present

## 2016-09-16 DIAGNOSIS — Z3480 Encounter for supervision of other normal pregnancy, unspecified trimester: Secondary | ICD-10-CM | POA: Diagnosis not present

## 2016-10-02 DIAGNOSIS — O30041 Twin pregnancy, dichorionic/diamniotic, first trimester: Secondary | ICD-10-CM | POA: Diagnosis not present

## 2016-10-02 DIAGNOSIS — Z3480 Encounter for supervision of other normal pregnancy, unspecified trimester: Secondary | ICD-10-CM | POA: Diagnosis not present

## 2016-10-02 DIAGNOSIS — O3680X2 Pregnancy with inconclusive fetal viability, fetus 2: Secondary | ICD-10-CM | POA: Diagnosis not present

## 2016-10-02 LAB — OB RESULTS CONSOLE HIV ANTIBODY (ROUTINE TESTING): HIV: NONREACTIVE

## 2016-10-02 LAB — OB RESULTS CONSOLE GC/CHLAMYDIA
Chlamydia: NEGATIVE
Gonorrhea: NEGATIVE

## 2016-10-02 LAB — OB RESULTS CONSOLE ABO/RH: RH TYPE: NEGATIVE

## 2016-10-02 LAB — OB RESULTS CONSOLE RPR: RPR: NONREACTIVE

## 2016-10-02 LAB — OB RESULTS CONSOLE RUBELLA ANTIBODY, IGM: RUBELLA: IMMUNE

## 2016-10-02 LAB — OB RESULTS CONSOLE ANTIBODY SCREEN: ANTIBODY SCREEN: NEGATIVE

## 2016-10-02 LAB — OB RESULTS CONSOLE HEPATITIS B SURFACE ANTIGEN: Hepatitis B Surface Ag: NEGATIVE

## 2016-10-07 ENCOUNTER — Inpatient Hospital Stay (HOSPITAL_COMMUNITY)
Admission: AD | Admit: 2016-10-07 | Discharge: 2016-10-08 | Disposition: A | Discharge status: Home / Self Care | Payer: 59 | Source: Ambulatory Visit | Attending: Obstetrics & Gynecology | Admitting: Obstetrics & Gynecology

## 2016-10-07 ENCOUNTER — Encounter (HOSPITAL_COMMUNITY): Payer: Self-pay

## 2016-10-07 DIAGNOSIS — O209 Hemorrhage in early pregnancy, unspecified: Secondary | ICD-10-CM | POA: Diagnosis not present

## 2016-10-07 DIAGNOSIS — Z6791 Unspecified blood type, Rh negative: Secondary | ICD-10-CM | POA: Insufficient documentation

## 2016-10-07 DIAGNOSIS — O30001 Twin pregnancy, unspecified number of placenta and unspecified number of amniotic sacs, first trimester: Secondary | ICD-10-CM | POA: Diagnosis not present

## 2016-10-07 DIAGNOSIS — O30042 Twin pregnancy, dichorionic/diamniotic, second trimester: Secondary | ICD-10-CM | POA: Insufficient documentation

## 2016-10-07 DIAGNOSIS — Z841 Family history of disorders of kidney and ureter: Secondary | ICD-10-CM | POA: Diagnosis not present

## 2016-10-07 DIAGNOSIS — Z9049 Acquired absence of other specified parts of digestive tract: Secondary | ICD-10-CM | POA: Diagnosis not present

## 2016-10-07 DIAGNOSIS — O26899 Other specified pregnancy related conditions, unspecified trimester: Secondary | ICD-10-CM

## 2016-10-07 DIAGNOSIS — Z79899 Other long term (current) drug therapy: Secondary | ICD-10-CM | POA: Diagnosis not present

## 2016-10-07 DIAGNOSIS — Z9889 Other specified postprocedural states: Secondary | ICD-10-CM | POA: Insufficient documentation

## 2016-10-07 DIAGNOSIS — O26891 Other specified pregnancy related conditions, first trimester: Secondary | ICD-10-CM

## 2016-10-07 DIAGNOSIS — Z811 Family history of alcohol abuse and dependence: Secondary | ICD-10-CM | POA: Diagnosis not present

## 2016-10-07 DIAGNOSIS — Z3A13 13 weeks gestation of pregnancy: Secondary | ICD-10-CM | POA: Insufficient documentation

## 2016-10-07 DIAGNOSIS — O200 Threatened abortion: Secondary | ICD-10-CM | POA: Diagnosis not present

## 2016-10-07 DIAGNOSIS — Z833 Family history of diabetes mellitus: Secondary | ICD-10-CM | POA: Diagnosis not present

## 2016-10-07 DIAGNOSIS — Z8249 Family history of ischemic heart disease and other diseases of the circulatory system: Secondary | ICD-10-CM | POA: Diagnosis not present

## 2016-10-07 LAB — URINALYSIS, ROUTINE W REFLEX MICROSCOPIC
BILIRUBIN URINE: NEGATIVE
Bacteria, UA: NONE SEEN
GLUCOSE, UA: NEGATIVE mg/dL
KETONES UR: NEGATIVE mg/dL
LEUKOCYTES UA: NEGATIVE
NITRITE: NEGATIVE
PROTEIN: NEGATIVE mg/dL
Specific Gravity, Urine: 1.003 — ABNORMAL LOW (ref 1.005–1.030)
pH: 7 (ref 5.0–8.0)

## 2016-10-07 NOTE — MAU Note (Signed)
Pt here with c/o vaginal bleeding starting tonight at 2100. Denies any pain. Denies any vaginal discharge.

## 2016-10-08 ENCOUNTER — Encounter (HOSPITAL_COMMUNITY): Payer: Self-pay | Admitting: Obstetrics and Gynecology

## 2016-10-08 ENCOUNTER — Inpatient Hospital Stay (HOSPITAL_COMMUNITY): Payer: 59

## 2016-10-08 DIAGNOSIS — Z9889 Other specified postprocedural states: Secondary | ICD-10-CM | POA: Diagnosis not present

## 2016-10-08 DIAGNOSIS — O30042 Twin pregnancy, dichorionic/diamniotic, second trimester: Secondary | ICD-10-CM | POA: Diagnosis not present

## 2016-10-08 DIAGNOSIS — O208 Other hemorrhage in early pregnancy: Secondary | ICD-10-CM | POA: Diagnosis not present

## 2016-10-08 DIAGNOSIS — O200 Threatened abortion: Secondary | ICD-10-CM | POA: Diagnosis not present

## 2016-10-08 DIAGNOSIS — Z833 Family history of diabetes mellitus: Secondary | ICD-10-CM | POA: Diagnosis not present

## 2016-10-08 DIAGNOSIS — O209 Hemorrhage in early pregnancy, unspecified: Secondary | ICD-10-CM

## 2016-10-08 DIAGNOSIS — Z6791 Unspecified blood type, Rh negative: Secondary | ICD-10-CM

## 2016-10-08 DIAGNOSIS — Z811 Family history of alcohol abuse and dependence: Secondary | ICD-10-CM | POA: Diagnosis not present

## 2016-10-08 DIAGNOSIS — Z3A13 13 weeks gestation of pregnancy: Secondary | ICD-10-CM | POA: Diagnosis not present

## 2016-10-08 DIAGNOSIS — O30001 Twin pregnancy, unspecified number of placenta and unspecified number of amniotic sacs, first trimester: Secondary | ICD-10-CM | POA: Diagnosis present

## 2016-10-08 DIAGNOSIS — Z8249 Family history of ischemic heart disease and other diseases of the circulatory system: Secondary | ICD-10-CM | POA: Diagnosis not present

## 2016-10-08 DIAGNOSIS — O26899 Other specified pregnancy related conditions, unspecified trimester: Secondary | ICD-10-CM

## 2016-10-08 DIAGNOSIS — Z9049 Acquired absence of other specified parts of digestive tract: Secondary | ICD-10-CM | POA: Diagnosis not present

## 2016-10-08 MED ORDER — RHO D IMMUNE GLOBULIN 1500 UNIT/2ML IJ SOSY
300.0000 ug | PREFILLED_SYRINGE | Freq: Once | INTRAMUSCULAR | Status: AC
  Administered 2016-10-08: 300 ug via INTRAMUSCULAR
  Filled 2016-10-08: qty 2

## 2016-10-08 NOTE — Discharge Instructions (Signed)
Please return to MAU, if your bleeding increases and/or pain is involved.

## 2016-10-08 NOTE — MAU Provider Note (Signed)
History     CSN: [id number]  Arrival date and time: 10/07/16 2305   First Provider Initiated Contact with Patient 10/08/16 0033      Chief Complaint  Patient presents with  . Vaginal Bleeding   HPI  Ms. Jennifer Mccormick is 36 yo G2P1001 at 13.6 wks IVF twin gestation presenting with complaints of vaginal bleeding that started at 2100 while cleaning up after dinner.  She reported she felt like she had to go to the bathroom.  She reports having "lots of bleeding" while she was in the bathroom. She denies any pain.  Past Medical History:  Diagnosis Date  . Gestational diabetes   . H/O varicella   . History of chlamydia   . 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 CHOLECYSTECTOMY    . cholestectomy    . COLPOSCOPY    . invitrofert    . invitrofertilization    . LEEP    . WISDOM TOOTH EXTRACTION      Family History  Problem Relation Age of Onset  . Hypertension Mother   . Alcohol abuse Father   . Diabetes Brother   . Hypertension Maternal Aunt   . Heart disease Maternal Grandmother   . Hypertension Maternal Grandmother   . Diabetes Maternal Grandmother   . Kidney disease Maternal Grandmother     Social History  Substance Use Topics  . Smoking status: Never Smoker  . Smokeless tobacco: Never Used  . Alcohol use No    Allergies: No Known Allergies  Prescriptions Prior to Admission  Medication Sig Dispense Refill Last Dose  . antipyrine-benzocaine (AURALGAN) otic solution Place 3-4 drops into the left ear every 2 (two) hours as needed for ear pain. 10 mL 0   . calcium carbonate-magnesium hydroxide (ROLAIDS) 334 MG CHEW Chew 1 tablet by mouth 2 (two) times daily with a meal. For indigestion   Past Week at Unknown time  . cefdinir (OMNICEF) 300 MG capsule Take 1 capsule (300 mg total) by mouth 2 (two) times daily. (Patient not taking: Reported on 04/07/2014) 20 capsule 0 Completed Course at Unknown time  .  HYDROcodone-acetaminophen (NORCO/VICODIN) 5-325 MG per tablet Take 1-2 tablets by mouth every 4 (four) hours as needed. 12 tablet 0   . ibuprofen (ADVIL,MOTRIN) 200 MG tablet Take 200 mg by mouth every 6 (six) hours as needed.   04/06/2014 at Unknown time  . naproxen sodium (ANAPROX) 220 MG tablet Take 220 mg by mouth 2 (two) times daily with a meal.   04/07/2014 at Unknown time  . predniSONE (DELTASONE) 10 MG tablet Take 6 tablets day 2 (day one given in ED) Take 5 tablets days 3 and 4 Take 4 tablets days 5 and 6 Take 3 tablets days 7 and 8 Take 2 tablets days 9 and 10 Take 1 tablet days 11 and 12 36 tablet 0   . Prenatal Vit-Fe Fumarate-FA (PRENATAL MULTIVITAMIN) TABS Take 1 tablet by mouth daily.   Past Month at Unknown time    Review of Systems  Constitutional: Negative.   HENT: Negative.   Eyes: Negative.   Respiratory: Negative.   Cardiovascular: Negative.   Gastrointestinal: Negative.   Endocrine: Negative.   Genitourinary: Positive for vaginal bleeding.  Musculoskeletal: Negative.   Skin: Negative.   Allergic/Immunologic: Negative.   Neurological: Negative.   Hematological: Negative.   Psychiatric/Behavioral: Negative.    Physical Exam   Blood pressure (!) 147/77, pulse 66, temperature  97.7 F (36.5 C), temperature source Oral, resp. rate 18, height 5\' 4"  (1.626 m), weight 100.7 kg (222 lb), SpO2 100 %.  Physical Exam  Constitutional: She is oriented to person, place, and time. She appears well-developed and well-nourished.  HENT:  Head: Normocephalic.  Eyes: Conjunctivae and EOM are normal. Pupils are equal, round, and reactive to light.  Neck: Normal range of motion.  Cardiovascular: Normal rate, regular rhythm, normal heart sounds and intact distal pulses.   Respiratory: Effort normal and breath sounds normal.  GI: Soft. Bowel sounds are normal.  Musculoskeletal: Normal range of motion.  Neurological: She is alert and oriented to person, place, and time. She has  normal reflexes.  Skin: Skin is warm and dry.  Psychiatric: She has a normal mood and affect. Her behavior is normal. Judgment and thought content normal.   Results for orders placed or performed during the hospital encounter of 10/07/16 (from the past 24 hour(s))  Urinalysis, Routine w reflex microscopic     Status: Abnormal   Collection Time: 10/07/16 11:20 PM  Result Value Ref Range   Color, Urine COLORLESS (A) YELLOW   APPearance CLEAR CLEAR   Specific Gravity, Urine 1.003 (L) 1.005 - 1.030   pH 7.0 5.0 - 8.0   Glucose, UA NEGATIVE NEGATIVE mg/dL   Hgb urine dipstick LARGE (A) NEGATIVE   Bilirubin Urine NEGATIVE NEGATIVE   Ketones, ur NEGATIVE NEGATIVE mg/dL   Protein, ur NEGATIVE NEGATIVE mg/dL   Nitrite NEGATIVE NEGATIVE   Leukocytes, UA NEGATIVE NEGATIVE   RBC / HPF 0-5 0 - 5 RBC/hpf   WBC, UA 0-5 0 - 5 WBC/hpf   Bacteria, UA NONE SEEN NONE SEEN   Squamous Epithelial / LPF 0-5 (A) NONE SEEN   US Ob Comp Less 14 Wks  Result Date: 10/08/2016 CLINICAL DATA:  Bleeding, infertility EXAM: TWIN OBSTETRICAL ULTRASOUND <14 WKS COMPARISON:  None. FINDINGS: Number of IUPs:  2 Chorionicity/Amnionicity:  Dichorionic-diamniotic (thick membrane) TWIN 1 Yolk sac:  Not Visualized. Embryo:  Visualized. Cardiac Activity: Visualized. Heart Rate: 159 bpm CRL:   71.7  mm   13 w 2 d                  [place] EDC: 04/13/2017 TWIN 2 Yolk sac:  Not Visualized. Embryo:  Visualized. Cardiac Activity: Visualized. Heart Rate: 171 bpm CRL:   76.3  mm   13 w 5 d                  [place] EDC: 04/10/2017 Subchorionic hemorrhage:  None visualized. Maternal uterus/adnexae: Normal left ovary. Right ovary was not visualized. There is no free fluid. IMPRESSION: Diamniotic, dichorionic twin viable intrauterine gestation as above. No subchorionic hemorrhage. Electronically Signed   By: Tollie Eth M.D.   On: 10/08/2016 02:09   [place] Mfm Ob Comp Addl Gest Less 14 Wks  Result Date: 10/08/2016 CLINICAL DATA:  Bleeding, infertility EXAM:  TWIN OBSTETRICAL ULTRASOUND <14 WKS COMPARISON:  None. FINDINGS: Number of IUPs:  2 Chorionicity/Amnionicity:  Dichorionic-diamniotic (thick membrane) TWIN 1 Yolk sac:  Not Visualized. Embryo:  Visualized. Cardiac Activity: Visualized. Heart Rate: 159 bpm CRL:   71.7  mm   13 w 2 d                  [place] EDC: 04/13/2017 TWIN 2 Yolk sac:  Not Visualized. Embryo:  Visualized. Cardiac Activity: Visualized. Heart Rate: 171 bpm CRL:   76.3  mm   13 w 5 d  [place] EDC: 04/10/2017 Subchorionic hemorrhage:  None visualized. Maternal uterus/adnexae: Normal left ovary. Right ovary was not visualized. There is no free fluid. IMPRESSION: Diamniotic, dichorionic twin viable intrauterine gestation as above. No subchorionic hemorrhage. Electronically Signed   By: Tollie Ethavid  Kwon M.D.   On: 10/08/2016 02:09   MAU Course  Procedures None  MDM CCUA Pelvic exam OB U/S Limited < 14 wks (multiple gestation)  Rhogam injection Consult with Dr. Mora ApplPinn: notified of lab results, assessment findings -- [facility]  Assessment and Plan  Vaginal bleeding in pregnancy, first trimester - F/U with Dr. Mora ApplPinn on Friday 10/09/16 -- call to schedule - Return to MAU for worsening bleeding and/or pain - Instructions on Threatened Miscarriage given  Twin gestation in first trimester, unspecified multiple gestation type   Rh negative status during pregnancy in first trimester - Rhogam 300 mcg IM injection prior to d/c home  D/C home Patient verbalized an understanding of the plan of care and agrees.   Raelyn Moraolitta Judie Hollick MSN, CNM 10/08/2016, 12:37 AM

## 2016-10-09 DIAGNOSIS — O3680X0 Pregnancy with inconclusive fetal viability, not applicable or unspecified: Secondary | ICD-10-CM | POA: Diagnosis not present

## 2016-10-09 LAB — RH IG WORKUP (INCLUDES ABO/RH)
ABO/RH(D): O NEG
Antibody Screen: NEGATIVE
GESTATIONAL AGE(WKS): 13.6
UNIT DIVISION: 0

## 2016-11-26 DIAGNOSIS — Z363 Encounter for antenatal screening for malformations: Secondary | ICD-10-CM | POA: Diagnosis not present

## 2016-11-26 DIAGNOSIS — Z36 Encounter for antenatal screening for chromosomal anomalies: Secondary | ICD-10-CM | POA: Diagnosis not present

## 2016-11-26 DIAGNOSIS — O30049 Twin pregnancy, dichorionic/diamniotic, unspecified trimester: Secondary | ICD-10-CM | POA: Diagnosis not present

## 2016-12-11 DIAGNOSIS — Z362 Encounter for other antenatal screening follow-up: Secondary | ICD-10-CM | POA: Diagnosis not present

## 2016-12-11 DIAGNOSIS — O30042 Twin pregnancy, dichorionic/diamniotic, second trimester: Secondary | ICD-10-CM | POA: Diagnosis not present

## 2016-12-11 DIAGNOSIS — O359XX1 Maternal care for (suspected) fetal abnormality and damage, unspecified, fetus 1: Secondary | ICD-10-CM | POA: Diagnosis not present

## 2016-12-15 ENCOUNTER — Other Ambulatory Visit (HOSPITAL_COMMUNITY): Payer: Self-pay | Admitting: Obstetrics and Gynecology

## 2016-12-15 DIAGNOSIS — Z3689 Encounter for other specified antenatal screening: Secondary | ICD-10-CM

## 2016-12-24 ENCOUNTER — Encounter (HOSPITAL_COMMUNITY): Payer: Self-pay

## 2016-12-24 ENCOUNTER — Ambulatory Visit (HOSPITAL_COMMUNITY)
Admission: RE | Admit: 2016-12-24 | Discharge: 2016-12-24 | Disposition: A | Discharge status: Home / Self Care | Payer: 59 | Source: Ambulatory Visit | Attending: Obstetrics and Gynecology | Admitting: Obstetrics and Gynecology

## 2016-12-24 DIAGNOSIS — Z363 Encounter for antenatal screening for malformations: Secondary | ICD-10-CM | POA: Diagnosis not present

## 2016-12-24 DIAGNOSIS — O09522 Supervision of elderly multigravida, second trimester: Secondary | ICD-10-CM | POA: Insufficient documentation

## 2016-12-24 DIAGNOSIS — O30042 Twin pregnancy, dichorionic/diamniotic, second trimester: Secondary | ICD-10-CM | POA: Insufficient documentation

## 2016-12-24 DIAGNOSIS — Z3A24 24 weeks gestation of pregnancy: Secondary | ICD-10-CM | POA: Insufficient documentation

## 2016-12-24 DIAGNOSIS — Z3689 Encounter for other specified antenatal screening: Secondary | ICD-10-CM

## 2016-12-24 DIAGNOSIS — O30001 Twin pregnancy, unspecified number of placenta and unspecified number of amniotic sacs, first trimester: Secondary | ICD-10-CM

## 2016-12-24 DIAGNOSIS — Z6791 Unspecified blood type, Rh negative: Secondary | ICD-10-CM

## 2016-12-24 DIAGNOSIS — O09812 Supervision of pregnancy resulting from assisted reproductive technology, second trimester: Secondary | ICD-10-CM | POA: Diagnosis not present

## 2016-12-24 DIAGNOSIS — O209 Hemorrhage in early pregnancy, unspecified: Secondary | ICD-10-CM

## 2016-12-24 DIAGNOSIS — O26891 Other specified pregnancy related conditions, first trimester: Secondary | ICD-10-CM

## 2016-12-24 HISTORY — DX: Papillomavirus as the cause of diseases classified elsewhere: B97.7

## 2016-12-25 ENCOUNTER — Other Ambulatory Visit (HOSPITAL_COMMUNITY): Payer: Self-pay | Admitting: *Deleted

## 2016-12-25 DIAGNOSIS — O30043 Twin pregnancy, dichorionic/diamniotic, third trimester: Secondary | ICD-10-CM

## 2016-12-30 ENCOUNTER — Other Ambulatory Visit (HOSPITAL_COMMUNITY): Payer: Self-pay

## 2016-12-31 ENCOUNTER — Observation Stay (HOSPITAL_COMMUNITY)
Admission: AD | Admit: 2016-12-31 | Discharge: 2017-01-01 | Disposition: A | Discharge status: Home / Self Care | Payer: 59 | Source: Ambulatory Visit | Attending: Obstetrics & Gynecology | Admitting: Obstetrics & Gynecology

## 2016-12-31 ENCOUNTER — Encounter (HOSPITAL_COMMUNITY): Payer: Self-pay | Admitting: Student

## 2016-12-31 ENCOUNTER — Inpatient Hospital Stay (HOSPITAL_COMMUNITY): Payer: 59

## 2016-12-31 ENCOUNTER — Ambulatory Visit (HOSPITAL_COMMUNITY)
Admit: 2016-12-31 | Discharge: 2016-12-31 | Disposition: A | Discharge status: Home / Self Care | Payer: 59 | Attending: Obstetrics & Gynecology | Admitting: Obstetrics & Gynecology

## 2016-12-31 DIAGNOSIS — O09812 Supervision of pregnancy resulting from assisted reproductive technology, second trimester: Secondary | ICD-10-CM | POA: Insufficient documentation

## 2016-12-31 DIAGNOSIS — O26852 Spotting complicating pregnancy, second trimester: Secondary | ICD-10-CM | POA: Diagnosis not present

## 2016-12-31 DIAGNOSIS — O09522 Supervision of elderly multigravida, second trimester: Secondary | ICD-10-CM | POA: Diagnosis not present

## 2016-12-31 DIAGNOSIS — O9989 Other specified diseases and conditions complicating pregnancy, childbirth and the puerperium: Secondary | ICD-10-CM | POA: Diagnosis not present

## 2016-12-31 DIAGNOSIS — Z3689 Encounter for other specified antenatal screening: Secondary | ICD-10-CM | POA: Diagnosis not present

## 2016-12-31 DIAGNOSIS — O30042 Twin pregnancy, dichorionic/diamniotic, second trimester: Secondary | ICD-10-CM

## 2016-12-31 DIAGNOSIS — R109 Unspecified abdominal pain: Secondary | ICD-10-CM | POA: Insufficient documentation

## 2016-12-31 DIAGNOSIS — Z3A25 25 weeks gestation of pregnancy: Secondary | ICD-10-CM | POA: Insufficient documentation

## 2016-12-31 DIAGNOSIS — O26892 Other specified pregnancy related conditions, second trimester: Secondary | ICD-10-CM | POA: Diagnosis not present

## 2016-12-31 DIAGNOSIS — Z79899 Other long term (current) drug therapy: Secondary | ICD-10-CM | POA: Diagnosis not present

## 2016-12-31 DIAGNOSIS — O4692 Antepartum hemorrhage, unspecified, second trimester: Secondary | ICD-10-CM | POA: Diagnosis not present

## 2016-12-31 DIAGNOSIS — O26899 Other specified pregnancy related conditions, unspecified trimester: Secondary | ICD-10-CM

## 2016-12-31 DIAGNOSIS — O26872 Cervical shortening, second trimester: Secondary | ICD-10-CM | POA: Insufficient documentation

## 2016-12-31 LAB — URINALYSIS, ROUTINE W REFLEX MICROSCOPIC
BILIRUBIN URINE: NEGATIVE
Glucose, UA: NEGATIVE mg/dL
Hgb urine dipstick: NEGATIVE
KETONES UR: NEGATIVE mg/dL
Leukocytes, UA: NEGATIVE
NITRITE: NEGATIVE
Protein, ur: NEGATIVE mg/dL
Specific Gravity, Urine: 1.006 (ref 1.005–1.030)
pH: 6 (ref 5.0–8.0)

## 2016-12-31 LAB — FETAL FIBRONECTIN: FETAL FIBRONECTIN: NEGATIVE

## 2016-12-31 LAB — CBC
HEMATOCRIT: 34.1 % — AB (ref 36.0–46.0)
HEMOGLOBIN: 11.7 g/dL — AB (ref 12.0–15.0)
MCH: 28.9 pg (ref 26.0–34.0)
MCHC: 34.3 g/dL (ref 30.0–36.0)
MCV: 84.2 fL (ref 78.0–100.0)
Platelets: 201 10*3/uL (ref 150–400)
RBC: 4.05 MIL/uL (ref 3.87–5.11)
RDW: 14.3 % (ref 11.5–15.5)
WBC: 12 10*3/uL — ABNORMAL HIGH (ref 4.0–10.5)

## 2016-12-31 LAB — TYPE AND SCREEN
ABO/RH(D): O NEG
Antibody Screen: NEGATIVE

## 2016-12-31 MED ORDER — NIFEDIPINE 10 MG PO CAPS
20.0000 mg | ORAL_CAPSULE | Freq: Once | ORAL | Status: AC
  Administered 2016-12-31: 20 mg via ORAL
  Filled 2016-12-31: qty 2

## 2016-12-31 MED ORDER — PROGESTERONE MICRONIZED 200 MG PO CAPS
200.0000 mg | ORAL_CAPSULE | Freq: Every day | ORAL | Status: DC
  Administered 2016-12-31: 200 mg via VAGINAL

## 2016-12-31 MED ORDER — RHO D IMMUNE GLOBULIN 1500 UNIT/2ML IJ SOSY
300.0000 ug | PREFILLED_SYRINGE | Freq: Once | INTRAMUSCULAR | Status: AC
  Administered 2016-12-31: 300 ug via INTRAMUSCULAR
  Filled 2016-12-31: qty 2

## 2016-12-31 MED ORDER — BETAMETHASONE SOD PHOS & ACET 6 (3-3) MG/ML IJ SUSP
12.0000 mg | Freq: Once | INTRAMUSCULAR | Status: AC
  Administered 2016-12-31: 12 mg via INTRAMUSCULAR
  Filled 2016-12-31: qty 2

## 2016-12-31 MED ORDER — LACTATED RINGERS IV SOLN
INTRAVENOUS | Status: DC
Start: 2016-12-31 — End: 2017-01-01
  Administered 2016-12-31 (×2): via INTRAVENOUS

## 2016-12-31 MED ORDER — DOCUSATE SODIUM 100 MG PO CAPS
100.0000 mg | ORAL_CAPSULE | Freq: Every day | ORAL | Status: DC
  Administered 2016-12-31 – 2017-01-01 (×2): 100 mg via ORAL
  Filled 2016-12-31 (×3): qty 1

## 2016-12-31 MED ORDER — ACETAMINOPHEN 325 MG PO TABS: 650.0000 mg | ORAL_TABLET | ORAL | Status: DC | PRN

## 2016-12-31 MED ORDER — LACTATED RINGERS IV BOLUS (SEPSIS)
1000.0000 mL | Freq: Once | INTRAVENOUS | Status: AC
  Administered 2016-12-31: 1000 mL via INTRAVENOUS

## 2016-12-31 MED ORDER — PROGESTERONE 200 MG VA SUPP
200.0000 mg | Freq: Every day | VAGINAL | Status: DC
  Filled 2016-12-31: qty 1

## 2016-12-31 MED ORDER — PRENATAL MULTIVITAMIN CH
1.0000 | ORAL_TABLET | Freq: Every day | ORAL | Status: DC
  Administered 2016-12-31 – 2017-01-01 (×2): 1 via ORAL
  Filled 2016-12-31 (×3): qty 1

## 2016-12-31 MED ORDER — ZOLPIDEM TARTRATE 5 MG PO TABS: 5.0000 mg | ORAL_TABLET | Freq: Every evening | ORAL | Status: DC | PRN

## 2016-12-31 MED ORDER — CALCIUM CARBONATE ANTACID 500 MG PO CHEW: 2.0000 | CHEWABLE_TABLET | ORAL | Status: DC | PRN

## 2016-12-31 MED ORDER — BETAMETHASONE SOD PHOS & ACET 6 (3-3) MG/ML IJ SUSP
12.0000 mg | Freq: Once | INTRAMUSCULAR | Status: AC
  Administered 2017-01-01: 12 mg via INTRAMUSCULAR
  Filled 2016-12-31: qty 2

## 2016-12-31 MED ORDER — NIFEDIPINE 10 MG PO CAPS
10.0000 mg | ORAL_CAPSULE | Freq: Four times a day (QID) | ORAL | Status: DC
  Administered 2016-12-31 – 2017-01-01 (×5): 10 mg via ORAL
  Filled 2016-12-31 (×5): qty 1

## 2016-12-31 NOTE — H&P (Addendum)
ANTEPARTUM H&P  Chief Complaint  Patient presents with  . Vaginal Bleeding  . Abdominal Pain   HPI Jennifer Mccormick is a 36 y.o. G2P1001 at [redacted]w[redacted]d with di/di twins who presents with vaginal bleeding. Reports constant abdominal pain & tightening for 24 hours starting last night. Pain resolved at 8 pm. Later tonight wiped and noticed pink spotting on toilet paper that has continued every time she wipes. Has not bled into underwear or passed clots. Denies n/v/d, constipation, vaginal discharge, dysuria, current abdominal pain, recent intercourse. Positive fetal movement.           OB History    Gravida Para Term Preterm AB Living   2 1 1  0 0 1   SAB TAB Ectopic Multiple Live Births   0 0 0 0 1          Past Medical History:  Diagnosis Date  . H/O varicella   . History of chlamydia   . HPV in female   . 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 CHOLECYSTECTOMY    . COLPOSCOPY    . invitrofertilization    . LAPAROSCOPIC GASTRIC BAND REMOVAL WITH LAPAROSCOPIC GASTRIC SLEEVE RESECTION  2017  . LEEP    . WISDOM TOOTH EXTRACTION           Family History  Problem Relation Age of Onset  . Hypertension Mother   . Alcohol abuse Father   . Diabetes Brother   . Hypertension Maternal Aunt   . Heart disease Maternal Grandmother   . Hypertension Maternal Grandmother   . Diabetes Maternal Grandmother   . Kidney disease Maternal Grandmother         Social History  Substance Use Topics  . Smoking status: Never Smoker  . Smokeless tobacco: Never Used  . Alcohol use No    Allergies: No Known Allergies         Prescriptions Prior to Admission  Medication Sig Dispense Refill Last Dose  . calcium carbonate-magnesium hydroxide (ROLAIDS) 334 MG CHEW Chew 1 tablet by mouth 2 (two) times daily with a meal. For indigestion   Past Week at Unknown time  . FOLIC ACID PO Take by mouth.    12/30/2016 at Unknown time  . Prenatal Vit-Fe Fumarate-FA (PRENATAL MULTIVITAMIN) TABS Take 1 tablet by mouth daily.   12/30/2016 at Unknown time  . ibuprofen (ADVIL,MOTRIN) 200 MG tablet Take 200 mg by mouth every 6 (six) hours as needed.   More than a month at Unknown time  . naproxen sodium (ANAPROX) 220 MG tablet Take 220 mg by mouth 2 (two) times daily with a meal.   More than a month at Unknown time  . [DISCONTINUED] antipyrine-benzocaine (AURALGAN) otic solution Place 3-4 drops into the left ear every 2 (two) hours as needed for ear pain. (Patient not taking: Reported on 12/24/2016) 10 mL 0 Not Taking  . [DISCONTINUED] cefdinir (OMNICEF) 300 MG capsule Take 1 capsule (300 mg total) by mouth 2 (two) times daily. (Patient not taking: Reported on 04/07/2014) 20 capsule 0 Completed Course at Unknown time  . [DISCONTINUED] HYDROcodone-acetaminophen (NORCO/VICODIN) 5-325 MG per tablet Take 1-2 tablets by mouth every 4 (four) hours as needed. (Patient not taking: Reported on 12/24/2016) 12 tablet 0 Not Taking  . [DISCONTINUED] predniSONE (DELTASONE) 10 MG tablet Take 6 tablets day 2 (day one given in ED) Take 5 tablets days 3 and 4 Take 4  tablets days 5 and 6 Take 3 tablets days 7 and 8 Take 2 tablets days 9 and 10 Take 1 tablet days 11 and 12 (Patient not taking: Reported on 12/24/2016) 36 tablet 0 Not Taking    Review of Systems  Constitutional: Negative.   Gastrointestinal: Positive for abdominal pain (none since 8 pm). Negative for anal bleeding, blood in stool, constipation, diarrhea, nausea and vomiting.  Genitourinary: Positive for vaginal bleeding. Negative for dysuria, vaginal discharge and vaginal pain.   Physical Exam - performed by NP in MAU   Blood pressure 136/70, pulse 74, temperature 97.8 F (36.6 C), temperature source Oral, resp. rate 17, last menstrual period 07/03/2016.  Constitutional: She is oriented to person, place, and time. She appears well-developed and  well-nourished. No distress.  HENT:  Head: Normocephalic and atraumatic.  Eyes: Conjunctivae are normal. Right eye exhibits no discharge. Left eye exhibits no discharge. No scleral icterus.  Neck: Normal range of motion.  Respiratory: Effort normal. No respiratory distress.  GI: Soft. There is no tenderness.  Mild contractions palpated  Genitourinary: Cervix exhibits no friability. No bleeding in the vagina. Vaginal discharge (small amount of white mucoid discharge) found.  Neurological: She is alert and oriented to person, place, and time.  Skin: Skin is warm and dry. She is not diaphoretic.  Psychiatric: She has a normal mood and affect. Her behavior is normal. Judgment and thought content normal.   Dilation: 1 Effacement (%): 40 Cervical Position: Posterior Exam by:: Lyman Bishop, NP  Fetal Tracing: Baby A Baseline: 145 Variability: moderate Accelerations: 10x10 Decelerations: none  Baby B Baseline: 140 Variability: moderate Accelerations:10x10 Decelerations: none  Toco: Irregular Q3-14 min w/some UI  Labs last 24 hours         Results for orders placed or performed during the hospital encounter of 12/31/16 (from the past 24 hour(s))  Urinalysis, Routine w reflex microscopic     Status: Abnormal   Collection Time: 12/31/16 12:28 AM  Result Value Ref Range   Color, Urine STRAW (A) YELLOW   APPearance CLEAR CLEAR   Specific Gravity, Urine 1.006 1.005 - 1.030   pH 6.0 5.0 - 8.0   Glucose, UA NEGATIVE NEGATIVE mg/dL   Hgb urine dipstick NEGATIVE NEGATIVE   Bilirubin Urine NEGATIVE NEGATIVE   Ketones, ur NEGATIVE NEGATIVE mg/dL   Protein, ur NEGATIVE NEGATIVE mg/dL   Nitrite NEGATIVE NEGATIVE   Leukocytes, UA NEGATIVE NEGATIVE  Fetal fibronectin     Status: None   Collection Time: 12/31/16  1:14 AM  Result Value Ref Range   Fetal Fibronectin NEGATIVE NEGATIVE  CBC     Status: Abnormal   Collection Time: 12/31/16  1:30 AM  Result Value Ref Range    WBC 12.0 (H) 4.0 - 10.5 K/uL   RBC 4.05 3.87 - 5.11 MIL/uL   Hemoglobin 11.7 (L) 12.0 - 15.0 g/dL   HCT 16.1 (L) 09.6 - 04.5 %   MCV 84.2 78.0 - 100.0 fL   MCH 28.9 26.0 - 34.0 pg   MCHC 34.3 30.0 - 36.0 g/dL   RDW 40.9 81.1 - 91.4 %   Platelets 201 150 - 400 K/uL       Assessment and Plan   1. 25 weeks Dichorionic / Diamniotic gestation of pregnancy   2. Vaginal bleeding in pregnancy, second trimester   3. Preterm contractions - FFN negative  4. IVF pregnancy   5. Shortened cervix 2.2 cm       Plan   1. Admit patient to  Antepartum for 24 hour observation  2. Patient received one dose of betamethasone for FLM.   3. Procardia for tocolysis  4. IV fluids (received bolus in MAU)  5. MFM and Neonatal consults  6.  Growth ultrasound tomorrow

## 2016-12-31 NOTE — MAU Provider Note (Signed)
History     CSN: [id number]  Arrival date and time: 12/31/16 0018  First Provider Initiated Contact with Patient 12/31/16 0101      Chief Complaint  Patient presents with  . Vaginal Bleeding  . Abdominal Pain   HPI Jennifer Mccormick is a 36 y.o. G2P1001 at [redacted]w[redacted]d with di/di twins who presents with vaginal bleeding. Reports constant abdominal pain & tightening for 24 hours starting last night. Pain resolved at 8 pm. Later tonight wiped and noticed pink spotting on toilet paper that has continued every time she wipes. Has not bled into underwear or passed clots. Denies n/v/d, constipation, vaginal discharge, dysuria, current abdominal pain, recent intercourse. Positive fetal movement.   OB History    Gravida Para Term Preterm AB Living   2 1 1  0 0 1   SAB TAB Ectopic Multiple Live Births   0 0 0 0 1      Past Medical History:  Diagnosis Date  . H/O varicella   . History of chlamydia   . HPV in female   . 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 CHOLECYSTECTOMY    . COLPOSCOPY    . invitrofertilization    . LAPAROSCOPIC GASTRIC BAND REMOVAL WITH LAPAROSCOPIC GASTRIC SLEEVE RESECTION  2017  . LEEP    . WISDOM TOOTH EXTRACTION      Family History  Problem Relation Age of Onset  . Hypertension Mother   . Alcohol abuse Father   . Diabetes Brother   . Hypertension Maternal Aunt   . Heart disease Maternal Grandmother   . Hypertension Maternal Grandmother   . Diabetes Maternal Grandmother   . Kidney disease Maternal Grandmother     Social History  Substance Use Topics  . Smoking status: Never Smoker  . Smokeless tobacco: Never Used  . Alcohol use No    Allergies: No Known Allergies  Prescriptions Prior to Admission  Medication Sig Dispense Refill Last Dose  . calcium carbonate-magnesium hydroxide (ROLAIDS) 334 MG CHEW Chew 1 tablet by mouth 2 (two) times daily with a meal. For indigestion   Past Week at Unknown time  .  FOLIC ACID PO Take by mouth.   12/30/2016 at Unknown time  . Prenatal Vit-Fe Fumarate-FA (PRENATAL MULTIVITAMIN) TABS Take 1 tablet by mouth daily.   12/30/2016 at Unknown time  . ibuprofen (ADVIL,MOTRIN) 200 MG tablet Take 200 mg by mouth every 6 (six) hours as needed.   More than a month at Unknown time  . naproxen sodium (ANAPROX) 220 MG tablet Take 220 mg by mouth 2 (two) times daily with a meal.   More than a month at Unknown time  . [DISCONTINUED] antipyrine-benzocaine (AURALGAN) otic solution Place 3-4 drops into the left ear every 2 (two) hours as needed for ear pain. (Patient not taking: Reported on 12/24/2016) 10 mL 0 Not Taking  . [DISCONTINUED] cefdinir (OMNICEF) 300 MG capsule Take 1 capsule (300 mg total) by mouth 2 (two) times daily. (Patient not taking: Reported on 04/07/2014) 20 capsule 0 Completed Course at Unknown time  . [DISCONTINUED] HYDROcodone-acetaminophen (NORCO/VICODIN) 5-325 MG per tablet Take 1-2 tablets by mouth every 4 (four) hours as needed. (Patient not taking: Reported on 12/24/2016) 12 tablet 0 Not Taking  . [DISCONTINUED] predniSONE (DELTASONE) 10 MG tablet Take 6 tablets day 2 (day one given in ED) Take 5 tablets days 3 and 4 Take 4 tablets days 5 and 6 Take 3 tablets  days 7 and 8 Take 2 tablets days 9 and 10 Take 1 tablet days 11 and 12 (Patient not taking: Reported on 12/24/2016) 36 tablet 0 Not Taking    Review of Systems  Constitutional: Negative.   Gastrointestinal: Positive for abdominal pain (none since 8 pm). Negative for anal bleeding, blood in stool, constipation, diarrhea, nausea and vomiting.  Genitourinary: Positive for vaginal bleeding. Negative for dysuria, vaginal discharge and vaginal pain.   Physical Exam   Blood pressure 136/70, pulse 74, temperature 97.8 F (36.6 C), temperature source Oral, resp. rate 17, last menstrual period 07/03/2016.  Physical Exam  Nursing note and vitals reviewed. Constitutional: She is oriented to person, place,  and time. She appears well-developed and well-nourished. No distress.  HENT:  Head: Normocephalic and atraumatic.  Eyes: Conjunctivae are normal. Right eye exhibits no discharge. Left eye exhibits no discharge. No scleral icterus.  Neck: Normal range of motion.  Respiratory: Effort normal. No respiratory distress.  GI: Soft. There is no tenderness.  Mild contractions palpated  Genitourinary: Cervix exhibits no friability. No bleeding in the vagina. Vaginal discharge (small amount of white mucoid discharge) found.  Neurological: She is alert and oriented to person, place, and time.  Skin: Skin is warm and dry. She is not diaphoretic.  Psychiatric: She has a normal mood and affect. Her behavior is normal. Judgment and thought content normal.   Dilation: 1 Effacement (%): 40 Cervical Position: Posterior Exam by:: Lyman BishopLawrence, NP  Fetal Tracing: Baby A Baseline: 145 Variability: moderate Accelerations: 10x10 Decelerations: none  Baby B Baseline: 140 Variability: moderate Accelerations:10x10 Decelerations: none  Toco: Irregular Q3-14 min w/some UI MAU Course  Procedures Results for orders placed or performed during the hospital encounter of 12/31/16 (from the past 24 hour(s))  Urinalysis, Routine w reflex microscopic     Status: Abnormal   Collection Time: 12/31/16 12:28 AM  Result Value Ref Range   Color, Urine STRAW (A) YELLOW   APPearance CLEAR CLEAR   Specific Gravity, Urine 1.006 1.005 - 1.030   pH 6.0 5.0 - 8.0   Glucose, UA NEGATIVE NEGATIVE mg/dL   Hgb urine dipstick NEGATIVE NEGATIVE   Bilirubin Urine NEGATIVE NEGATIVE   Ketones, ur NEGATIVE NEGATIVE mg/dL   Protein, ur NEGATIVE NEGATIVE mg/dL   Nitrite NEGATIVE NEGATIVE   Leukocytes, UA NEGATIVE NEGATIVE  Fetal fibronectin     Status: None   Collection Time: 12/31/16  1:14 AM  Result Value Ref Range   Fetal Fibronectin NEGATIVE NEGATIVE  CBC     Status: Abnormal   Collection Time: 12/31/16  1:30 AM  Result  Value Ref Range   WBC 12.0 (H) 4.0 - 10.5 K/uL   RBC 4.05 3.87 - 5.11 MIL/uL   Hemoglobin 11.7 (L) 12.0 - 15.0 g/dL   HCT 16.134.1 (L) 09.636.0 - 04.546.0 %   MCV 84.2 78.0 - 100.0 fL   MCH 28.9 26.0 - 34.0 pg   MCHC 34.3 30.0 - 36.0 g/dL   RDW 40.914.3 81.111.5 - 91.415.5 %   Platelets 201 150 - 400 K/uL    MDM Category 1 tracing Irr contractions noted on TOCO, palpate mild. Patient reports no pain & not feeling contractions.  No blood noted on exam, patient RH negative. Will collect RHIG workup & consider rhogham administration FFN pending -- negative IV fluid bolus ordered Ultrasound -- cervical length 2.2 cm BMZ Discussed results with Dr. Mora ApplPinn who will come to MAU to inform patient of POC Assessment and Plan  A: 1. [redacted]  weeks gestation of pregnancy   2. Vaginal bleeding in pregnancy, second trimester   3. Abdominal pain affecting pregnancy   4. Twin pregnancy, dichorionic/diamniotic, second trimester    P: Admission per Dr. Candice Camp 12/31/2016, 1:01 AM

## 2016-12-31 NOTE — Progress Notes (Signed)
Maternal Fetal Medicine Consultation  Requesting Provider(s): Pinn  Primary OB: Henderson CloudHorvath Reason for consultation: Dichorionic diamniotic twin gestation with short cervix  HPI: 35yo P1001 at 25+6 weeks with dichorionic diamniotic twin gestations via in vitro fertilization. Admitted after a 24 hour history of vague pelvic pressure and "stomach tightening" culminating with vaginal bleeding. US this AM showed a 22mm cervix with funneling. She reports a 3-4 day prodromal phase of pelvic pressure. Until this point she has had no problems with the pregnancy OB History: OB History    Gravida Para Term Preterm AB Living   2 1 1  0 0 1   SAB TAB Ectopic Multiple Live Births   0 0 0 0 1    First pregnancy was IVF, delivered at term 4000g infant without complications  PMH:  Past Medical History:  Diagnosis Date  . H/O varicella   . History of chlamydia   . HPV in female   . 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 CHOLECYSTECTOMY    . COLPOSCOPY    . invitrofertilization    . LAPAROSCOPIC GASTRIC BAND REMOVAL WITH LAPAROSCOPIC GASTRIC SLEEVE RESECTION  2017  . LEEP    . WISDOM TOOTH EXTRACTION     Meds: See EPIC section Allergies: NKDA FH: See EPIC section Soc: See EPIC section  Review of Systems: no vaginal bleeding or cramping/contractions, no LOF, no nausea/vomiting. All other systems reviewed and are negative.  PE:  VS: See EPIC section GEN: well-appearing female ABD: gravid, NT  Please see separate document for fetal ultrasound report.  A/P: IUP at 25+6 weeks with dichorionic diamniotic twins with short cervix and symptoms consistent with preterm labor. The patient has already started betamethasone series; she has not had neuroprotective MgSO4. I would hold off on MgSO4 until she was threatening delivery, but I would complete the betamethasone series. I would also hold off on her oral GTT for 10 days after her second steroid  injection. She is currently receiving nifedipine. If you continue that I would consider using the short-acting version at 10mg  q 6 hours. She is also a candidate for vaginal progesterone therapy, either 200mg  micronized progesterone nightly at bedtime per vagina, or using the 8% Crinone progesterone preparation, one applicatorful nightly at bedtime. Would also recommend that this patient be taken off work as she has a very active job as a Buyer, retailrespiratory therapist at [employer]. Lawrence MarseillesBedrest is not necessary but decreased activity is preferred. Please make an appointment for her to have another transvaginal cervical length in 1 week      Thank you for the opportunity to be a part of the care of Jennifer GaskinsJennifer D Mccormick. Please contact our office if we can be of further assistance.   I spent approximately 30 minutes with this patient with over 50% of time spent in face-to-face counseling.

## 2016-12-31 NOTE — MAU Note (Signed)
Pt states that she had a constant achy pain over the last 24 hours. States tonight she noticed some spotting when wiping. Pt states pain has gotten somewhat better. Reports good fetal movement.

## 2016-12-31 NOTE — Consult Note (Signed)
Asked by Dr.Pinn to provide prenatal consultation for patient at risk for preterm delivery.  Mother is 36 y.o. G2 P1 who is now 25.[redacted] weeks EGA, with IVF pregnancy, boy/girl twins, otherwise uncomplicated until last night when she had bleeding and mild UCs. She is being treated with betamethasone and Procardia and will be started on progesterone.  Met with patient, FOB, and patient's parents and discussed usual expectations for preterm infants at 26+ weeks gestation, including possible needs for DR resuscitation, respiratory support, IV access.  Presented optimisitc outlook for survival, especially if delivery delayed until 24 hours post BMZ# 2 (to be given 0200 tomorrow), but cautioned about possibility of serious morbidity.  Projected possible length of stay in NICU until [facility] or so.  Discussed advantages of feeding with mother's milk.  She plans to pump postnatally.  Patient and FOB were attentive, had appropriate questions, and were appreciative of my input.  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  Thank you for consulting Neonatology.  Total time 30 minutes  JWimmer, MD

## 2016-12-31 NOTE — Progress Notes (Signed)
36 y.o. G2P1001 6658w6d HD#0 admitted for 25.6 WEEKS PAIN SPOTTING.  Pt currently stable with no c/o.  Good FM.  Vitals:   12/31/16 0043 12/31/16 0348  BP: 136/70 128/68  Pulse: 74 78  Resp: 17 18  Temp: 97.8 F (36.6 C) 98 F (36.7 C)  TempSrc: Oral Oral  SpO2:  100%    Lungs CTA Cor RRR Abd  Soft, gravid, nontender Ex SCDs FHTs  A 120s, good short term variability, NST R  B 130s, g STV, NST R Toco  occ  Results for orders placed or performed during the hospital encounter of 12/31/16 (from the past 24 hour(s))  Urinalysis, Routine w reflex microscopic     Status: Abnormal   Collection Time: 12/31/16 12:28 AM  Result Value Ref Range   Color, Urine STRAW (A) YELLOW   APPearance CLEAR CLEAR   Specific Gravity, Urine 1.006 1.005 - 1.030   pH 6.0 5.0 - 8.0   Glucose, UA NEGATIVE NEGATIVE mg/dL   Hgb urine dipstick NEGATIVE NEGATIVE   Bilirubin Urine NEGATIVE NEGATIVE   Ketones, ur NEGATIVE NEGATIVE mg/dL   Protein, ur NEGATIVE NEGATIVE mg/dL   Nitrite NEGATIVE NEGATIVE   Leukocytes, UA NEGATIVE NEGATIVE  Fetal fibronectin     Status: None   Collection Time: 12/31/16  1:14 AM  Result Value Ref Range   Fetal Fibronectin NEGATIVE NEGATIVE  Rh IG workup (includes ABO/Rh)     Status: None (Preliminary result)   Collection Time: 12/31/16  1:30 AM  Result Value Ref Range   Gestational Age(Wks) 25    ABO/RH(D) O NEG    Antibody Screen NEG    Fetal Screen NEG    Unit Number [id number]    Blood Component Type RHIG    Unit division 00    Status of Unit ISSUED    Transfusion Status OK TO TRANSFUSE   CBC     Status: Abnormal   Collection Time: 12/31/16  1:30 AM  Result Value Ref Range   WBC 12.0 (H) 4.0 - 10.5 K/uL   RBC 4.05 3.87 - 5.11 MIL/uL   Hemoglobin 11.7 (L) 12.0 - 15.0 g/dL   HCT 81.134.1 (L) 91.436.0 - 78.246.0 %   MCV 84.2 78.0 - 100.0 fL   MCH 28.9 26.0 - 34.0 pg   MCHC 34.3 30.0 - 36.0 g/dL   RDW 95.614.3 21.311.5 - 08.615.5 %   Platelets 201 150 - 400 K/uL  Type and screen  [facility]     Status: None   Collection Time: 12/31/16  1:30 AM  Result Value Ref Range   ABO/RH(D) O NEG    Antibody Screen NEG    Sample Expiration 01/03/2017    Assessment and Plan   1. 25 weeks Dichorionic / Diamniotic gestation of pregnancy   2. Vaginal bleeding in pregnancy, second trimester   3. Preterm contractions - FFN negative  4. IVF pregnancy   5. Shortened cervix 2.2 cm       Plan   1. Admit patient to Antepartum for 24 hour observation  2. Patient received one dose of betamethasone for FLM- second dose due today  3. Procardia for tocolysis  4. IV fluids (received bolus in MAU)  5. MFM and Neonatal consults  6.  Growth ultrasound today        Rolinda Impson A

## 2017-01-01 DIAGNOSIS — O09522 Supervision of elderly multigravida, second trimester: Secondary | ICD-10-CM | POA: Diagnosis not present

## 2017-01-01 DIAGNOSIS — O26852 Spotting complicating pregnancy, second trimester: Secondary | ICD-10-CM | POA: Diagnosis not present

## 2017-01-01 DIAGNOSIS — Z79899 Other long term (current) drug therapy: Secondary | ICD-10-CM | POA: Diagnosis not present

## 2017-01-01 DIAGNOSIS — O09812 Supervision of pregnancy resulting from assisted reproductive technology, second trimester: Secondary | ICD-10-CM | POA: Diagnosis not present

## 2017-01-01 DIAGNOSIS — O26872 Cervical shortening, second trimester: Secondary | ICD-10-CM | POA: Diagnosis not present

## 2017-01-01 DIAGNOSIS — O30042 Twin pregnancy, dichorionic/diamniotic, second trimester: Secondary | ICD-10-CM | POA: Diagnosis not present

## 2017-01-01 DIAGNOSIS — O9989 Other specified diseases and conditions complicating pregnancy, childbirth and the puerperium: Secondary | ICD-10-CM | POA: Diagnosis not present

## 2017-01-01 DIAGNOSIS — R109 Unspecified abdominal pain: Secondary | ICD-10-CM | POA: Diagnosis not present

## 2017-01-01 DIAGNOSIS — Z3A25 25 weeks gestation of pregnancy: Secondary | ICD-10-CM | POA: Diagnosis not present

## 2017-01-01 LAB — RH IG WORKUP (INCLUDES ABO/RH)
ABO/RH(D): O NEG
ANTIBODY SCREEN: NEGATIVE
FETAL SCREEN: NEGATIVE
GESTATIONAL AGE(WKS): 25
Unit division: 0

## 2017-01-01 MED ORDER — PROGESTERONE MICRONIZED 200 MG PO CAPS: 200.0000 mg | ORAL_CAPSULE | Freq: Every day | ORAL | 2 refills | Status: DC

## 2017-01-01 MED FILL — PROGESTERONE 200 MG CAPSULE: 200 | 30 days supply | Qty: 30 | Fill #0

## 2017-01-01 NOTE — Discharge Summary (Signed)
Patient presented with small amount of spotting and abdominal discomfort.  US indicated cervical length of 2.2cm. FFN collected was negative.  On call doctor admitted patient for steroid administration and 24 hour observation with tocolysis with procardia.  Pt reported no significant contractions and feeling much better 8/31, day of discharge.  She received both steroid injections and was started on vaginal progesterone.  This was prescribed to continue nightly at home.  Pt instructed to f/u in office next week and PTL precautions were reviewed.

## 2017-01-03 ENCOUNTER — Inpatient Hospital Stay (HOSPITAL_COMMUNITY)
Admission: AD | Admit: 2017-01-03 | Discharge: 2017-01-03 | Disposition: A | Discharge status: Home / Self Care | Payer: 59 | Source: Ambulatory Visit | Attending: Obstetrics and Gynecology | Admitting: Obstetrics and Gynecology

## 2017-01-03 ENCOUNTER — Encounter (HOSPITAL_COMMUNITY): Payer: Self-pay | Admitting: *Deleted

## 2017-01-03 DIAGNOSIS — O4692 Antepartum hemorrhage, unspecified, second trimester: Secondary | ICD-10-CM | POA: Insufficient documentation

## 2017-01-03 DIAGNOSIS — Z3A26 26 weeks gestation of pregnancy: Secondary | ICD-10-CM | POA: Diagnosis not present

## 2017-01-03 DIAGNOSIS — O30042 Twin pregnancy, dichorionic/diamniotic, second trimester: Secondary | ICD-10-CM | POA: Diagnosis not present

## 2017-01-03 DIAGNOSIS — O24419 Gestational diabetes mellitus in pregnancy, unspecified control: Secondary | ICD-10-CM | POA: Diagnosis not present

## 2017-01-03 LAB — WET PREP, GENITAL
Clue Cells Wet Prep HPF POC: NONE SEEN
Sperm: NONE SEEN
Trich, Wet Prep: NONE SEEN
Yeast Wet Prep HPF POC: NONE SEEN

## 2017-01-03 LAB — URINALYSIS, ROUTINE W REFLEX MICROSCOPIC
Bilirubin Urine: NEGATIVE
GLUCOSE, UA: NEGATIVE mg/dL
HGB URINE DIPSTICK: NEGATIVE
KETONES UR: NEGATIVE mg/dL
Leukocytes, UA: NEGATIVE
Nitrite: NEGATIVE
PROTEIN: NEGATIVE mg/dL
Specific Gravity, Urine: 1.011 (ref 1.005–1.030)
pH: 7 (ref 5.0–8.0)

## 2017-01-03 NOTE — MAU Note (Signed)
Pt woke up @ 0600, noted pink spotting in underwear, not bright red.  Also noted more on toilet tissue.  Has some mild discomfort in back, no actual pain.

## 2017-01-03 NOTE — MAU Provider Note (Signed)
History     CSN: [id number]  Arrival date and time: 01/03/17 [id number]  First Provider Initiated Contact with Patient 01/03/17 [phone]      Chief Complaint  Patient presents with  . Vaginal Bleeding   HPI  Jennifer Mccormick is a 36 y.o. G2P1001 at 7736w2d with di/di twins who presents with vaginal bleeding. Symptoms began this morning. Reports dark pink spotting in her underwear when she woke up. Was recently admitted for spotting & shortened cervix; was discharged on 8/31 after being given rhogham & antenatal steroids. Denies abdominal pain, LOF, dysuria, vomiting, diarrhea, constipation, recent intercourse, or vaginal discharge. Positive fetal movement x 2.   OB History    Gravida Para Term Preterm AB Living   2 1 1  0 0 1   SAB TAB Ectopic Multiple Live Births   0 0 0 0 1      Past Medical History:  Diagnosis Date  . Gestational diabetes   . H/O varicella   . History of chlamydia   . HPV in female   . 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 CHOLECYSTECTOMY    . COLPOSCOPY    . invitrofertilization    . LAPAROSCOPIC GASTRIC BAND REMOVAL WITH LAPAROSCOPIC GASTRIC SLEEVE RESECTION  2017  . LEEP    . WISDOM TOOTH EXTRACTION      Family History  Problem Relation Age of Onset  . Hypertension Mother   . Alcohol abuse Father   . Diabetes Brother   . Hypertension Maternal Aunt   . Heart disease Maternal Grandmother   . Hypertension Maternal Grandmother   . Diabetes Maternal Grandmother   . Kidney disease Maternal Grandmother     Social History  Substance Use Topics  . Smoking status: Never Smoker  . Smokeless tobacco: Never Used  . Alcohol use No    Allergies: No Known Allergies  Prescriptions Prior to Admission  Medication Sig Dispense Refill Last Dose  . FOLIC ACID PO Take by mouth.   12/30/2016 at Unknown time  . Prenatal Vit-Fe Fumarate-FA (PRENATAL MULTIVITAMIN) TABS Take 1 tablet by mouth daily.   12/30/2016 at Unknown  time  . progesterone (PROMETRIUM) 200 MG capsule Place 1 capsule (200 mg total) vaginally at bedtime. 30 capsule 2     Review of Systems  Constitutional: Negative.   Gastrointestinal: Negative.   Genitourinary: Positive for vaginal bleeding. Negative for dysuria and vaginal discharge.   Physical Exam   Blood pressure 130/70, pulse 80, temperature 98.1 F (36.7 C), temperature source Oral, resp. rate 18, last menstrual period 07/03/2016.  Physical Exam  Nursing note and vitals reviewed. Constitutional: She is oriented to person, place, and time. She appears well-developed and well-nourished. No distress.  HENT:  Head: Normocephalic and atraumatic.  Eyes: Conjunctivae are normal. Right eye exhibits no discharge. Left eye exhibits no discharge. No scleral icterus.  Neck: Normal range of motion.  Respiratory: Effort normal. No respiratory distress.  GI: Soft. There is no tenderness.  Genitourinary:  Genitourinary Comments: Small amount yellow mucoid discharge at os with streak of dark red blood. No active bleeding. SVE 1/40/-3  Neurological: She is alert and oriented to person, place, and time.  Skin: Skin is warm and dry. She is not diaphoretic.  Psychiatric: She has a normal mood and affect. Her behavior is normal. Judgment and thought content normal.   Dilation: 1 Effacement (%): 40 Cervical Position: Posterior Exam by:: Estanislado SpireE. Shalom Mcguiness NP  Fetal Tracing: Baby A Baseline: 150 Variability: moderate Accelerations: 15x15 Decelerations: none  Baby B Baseline: 145 Variability: moderate Accelerations:15x15 Decelerations: none  Toco: UI & ctx x1  MAU Course  Procedures Results for orders placed or performed during the hospital encounter of 01/03/17 (from the past 24 hour(s))  Urinalysis, Routine w reflex microscopic     Status: Abnormal   Collection Time: 01/03/17  7:42 AM  Result Value Ref Range   Color, Urine YELLOW YELLOW   APPearance HAZY (A) CLEAR   Specific Gravity,  Urine 1.011 1.005 - 1.030   pH 7.0 5.0 - 8.0   Glucose, UA NEGATIVE NEGATIVE mg/dL   Hgb urine dipstick NEGATIVE NEGATIVE   Bilirubin Urine NEGATIVE NEGATIVE   Ketones, ur NEGATIVE NEGATIVE mg/dL   Protein, ur NEGATIVE NEGATIVE mg/dL   Nitrite NEGATIVE NEGATIVE   Leukocytes, UA NEGATIVE NEGATIVE  Wet prep, genital     Status: Abnormal   Collection Time: 01/03/17  8:42 AM  Result Value Ref Range   Yeast Wet Prep HPF POC NONE SEEN NONE SEEN   Trich, Wet Prep NONE SEEN NONE SEEN   Clue Cells Wet Prep HPF POC NONE SEEN NONE SEEN   WBC, Wet Prep HPF POC MANY (A) NONE SEEN   Sperm NONE SEEN     MDM Reactive NST x 2 SVE unchanged from previous visit. FFN not collected as patient had negative FFN on 8/30 & used vaginal progesterone last night. Minimal streak of blood noted on exam, no active bleeding.  Discussed patient with Dr. Claiborne Billings. Ok to discharge home.  Assessment and Plan  A: 1. Vaginal bleeding in pregnancy, second trimester   2. Twin pregnancy, dichorionic/diamniotic, second trimester    P: Discharge home Discussed reasons to return to MAU including s/s of PTL and/or vaginal bleeding (vs spotting) Keep f/u with OB or call as needed  Judeth Horn 01/03/2017, 8:34 AM

## 2017-01-03 NOTE — Discharge Instructions (Signed)
Vaginal Bleeding During Pregnancy, Second Trimester °A small amount of bleeding (spotting) from the vagina is relatively common in pregnancy. It usually stops on its own. Various things can cause bleeding or spotting in pregnancy. Some bleeding may be related to the pregnancy, and some may not. Sometimes the bleeding is normal and is not a problem. However, bleeding can also be a sign of something serious. Be sure to tell your health care provider about any vaginal bleeding right away. °Some possible causes of vaginal bleeding during the second trimester include: °· Infection, inflammation, or growths on the cervix. °· The placenta may be partially or completely covering the opening of the cervix inside the uterus (placenta previa). °· The placenta may have separated from the uterus (abruption of the placenta). °· You may be having early (preterm) labor. °· The cervix may not be strong enough to keep a baby inside the uterus (cervical insufficiency). °· Tiny cysts may have developed in the uterus instead of pregnancy tissue (molar pregnancy). ° °Follow these instructions at home: °Watch your condition for any changes. The following actions may help to lessen any discomfort you are feeling: °· Follow your health care provider's instructions for limiting your activity. If your health care provider orders bed rest, you may need to stay in bed and only get up to use the bathroom. However, your health care provider may allow you to continue light activity. °· If needed, make plans for someone to help with your regular activities and responsibilities while you are on bed rest. °· Keep track of the number of pads you use each day, how often you change pads, and how soaked (saturated) they are. Write this down. °· Do not use tampons. Do not douche. °· Do not have sexual intercourse or orgasms until approved by your health care provider. °· If you pass any tissue from your vagina, save the tissue so you can show it to your  health care provider. °· Only take over-the-counter or prescription medicines as directed by your health care provider. °· Do not take aspirin because it can make you bleed. °· Do not exercise or perform any strenuous activities or heavy lifting without your health care provider's permission. °· Keep all follow-up appointments as directed by your health care provider. ° °Contact a health care provider if: °· You have any vaginal bleeding during any part of your pregnancy. °· You have cramps or labor pains. °· You have a fever, not controlled by medicine. °Get help right away if: °· You have severe cramps in your back or belly (abdomen). °· You have contractions. °· You have chills. °· You pass large clots or tissue from your vagina. °· Your bleeding increases. °· You feel light-headed or weak, or you have fainting episodes. °· You are leaking fluid or have a gush of fluid from your vagina. °This information is not intended to replace advice given to you by your health care provider. Make sure you discuss any questions you have with your health care provider. °Document Released: 01/28/2005 Document Revised: 09/26/2015 Document Reviewed: 12/26/2012 °Elsevier Interactive Patient Education © 2018 [employer] ° °

## 2017-01-06 DIAGNOSIS — Z3480 Encounter for supervision of other normal pregnancy, unspecified trimester: Secondary | ICD-10-CM | POA: Diagnosis not present

## 2017-01-07 LAB — GC/CHLAMYDIA PROBE AMP (~~LOC~~) NOT AT ARMC
Chlamydia: NEGATIVE
Neisseria Gonorrhea: NEGATIVE

## 2017-01-21 ENCOUNTER — Ambulatory Visit (HOSPITAL_COMMUNITY)
Admission: RE | Admit: 2017-01-21 | Discharge: 2017-01-21 | Disposition: A | Discharge status: Home / Self Care | Payer: 59 | Source: Ambulatory Visit | Attending: Obstetrics and Gynecology | Admitting: Obstetrics and Gynecology

## 2017-01-21 ENCOUNTER — Other Ambulatory Visit (HOSPITAL_COMMUNITY): Payer: Self-pay | Admitting: Obstetrics and Gynecology

## 2017-01-21 ENCOUNTER — Encounter (HOSPITAL_COMMUNITY): Payer: Self-pay

## 2017-01-21 ENCOUNTER — Other Ambulatory Visit (HOSPITAL_COMMUNITY): Payer: Self-pay | Admitting: *Deleted

## 2017-01-21 DIAGNOSIS — O30043 Twin pregnancy, dichorionic/diamniotic, third trimester: Secondary | ICD-10-CM | POA: Diagnosis not present

## 2017-01-21 DIAGNOSIS — Z3A28 28 weeks gestation of pregnancy: Secondary | ICD-10-CM

## 2017-01-21 DIAGNOSIS — O26849 Uterine size-date discrepancy, unspecified trimester: Secondary | ICD-10-CM | POA: Diagnosis not present

## 2017-01-21 DIAGNOSIS — O30041 Twin pregnancy, dichorionic/diamniotic, first trimester: Secondary | ICD-10-CM | POA: Diagnosis not present

## 2017-01-21 DIAGNOSIS — O09813 Supervision of pregnancy resulting from assisted reproductive technology, third trimester: Secondary | ICD-10-CM | POA: Diagnosis not present

## 2017-01-21 DIAGNOSIS — O09523 Supervision of elderly multigravida, third trimester: Secondary | ICD-10-CM

## 2017-01-27 MED FILL — PROGESTERONE 200 MG CAPSULE: 200 | 30 days supply | Qty: 30 | Fill #1

## 2017-01-28 ENCOUNTER — Inpatient Hospital Stay (HOSPITAL_COMMUNITY)
Admission: AD | Admit: 2017-01-28 | Discharge: 2017-01-28 | Disposition: A | Discharge status: Home / Self Care | Payer: 59 | Source: Ambulatory Visit | Attending: Obstetrics | Admitting: Obstetrics

## 2017-01-28 ENCOUNTER — Encounter (HOSPITAL_COMMUNITY): Payer: Self-pay

## 2017-01-28 DIAGNOSIS — O4703 False labor before 37 completed weeks of gestation, third trimester: Secondary | ICD-10-CM

## 2017-01-28 DIAGNOSIS — Z3A29 29 weeks gestation of pregnancy: Secondary | ICD-10-CM | POA: Diagnosis not present

## 2017-01-28 DIAGNOSIS — O30043 Twin pregnancy, dichorionic/diamniotic, third trimester: Secondary | ICD-10-CM | POA: Diagnosis not present

## 2017-01-28 DIAGNOSIS — O209 Hemorrhage in early pregnancy, unspecified: Secondary | ICD-10-CM

## 2017-01-28 DIAGNOSIS — Z3689 Encounter for other specified antenatal screening: Secondary | ICD-10-CM | POA: Diagnosis not present

## 2017-01-28 LAB — URINALYSIS, ROUTINE W REFLEX MICROSCOPIC
BILIRUBIN URINE: NEGATIVE
GLUCOSE, UA: NEGATIVE mg/dL
HGB URINE DIPSTICK: NEGATIVE
KETONES UR: 20 mg/dL — AB
Leukocytes, UA: NEGATIVE
NITRITE: NEGATIVE
PH: 6 (ref 5.0–8.0)
PROTEIN: 30 mg/dL — AB
Specific Gravity, Urine: 1.019 (ref 1.005–1.030)

## 2017-01-28 LAB — WET PREP, GENITAL
CLUE CELLS WET PREP: NONE SEEN
Sperm: NONE SEEN
Trich, Wet Prep: NONE SEEN
Yeast Wet Prep HPF POC: NONE SEEN

## 2017-01-28 LAB — AMNISURE RUPTURE OF MEMBRANE (ROM) NOT AT ARMC: AMNISURE: NEGATIVE

## 2017-01-28 MED ORDER — NIFEDIPINE 10 MG PO CAPS
10.0000 mg | ORAL_CAPSULE | ORAL | Status: AC
  Administered 2017-01-28 (×3): 10 mg via ORAL
  Filled 2017-01-28 (×3): qty 1

## 2017-01-28 MED ORDER — NIFEDIPINE 10 MG PO CAPS: 10.0000 mg | ORAL_CAPSULE | Freq: Four times a day (QID) | ORAL | 0 refills | Status: DC | PRN

## 2017-01-28 NOTE — Discharge Instructions (Signed)

## 2017-01-28 NOTE — MAU Provider Note (Signed)
History     CSN: [id number]  Arrival date and time: 01/28/17 1621   First Provider Initiated Contact with Patient 01/28/17 1745      Chief Complaint  Patient presents with  . Contractions   G2P1001 .6 with didi twins here with ctx. Reports having ctx all the time but became worse last night. She is unsure of frequency but thinks >6 per hr. Duration in 3-4 min. Denies VB or LOF but has some vaginal discharge from vaginal Progesterone. She also reports a small amount of thin clear discharge earlier that ran down thigh. No leaking since. Reports good FM x2. Her pregnancy is complicated by shortened cervix    OB History    Gravida Para Term Preterm AB Living   0 0 1   SAB TAB Ectopic Multiple Live Births   0 0 0 0 1      Past Medical History:  Diagnosis Date  . Gestational diabetes   . H/O varicella   . History of chlamydia   . HPV in female   . 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 CHOLECYSTECTOMY    . COLPOSCOPY    . invitrofertilization    . LAPAROSCOPIC GASTRIC BAND REMOVAL WITH LAPAROSCOPIC GASTRIC SLEEVE RESECTION  2017  . LEEP    . WISDOM TOOTH EXTRACTION      Family History  Problem Relation Age of Onset  . Hypertension Mother   . Alcohol abuse Father   . Diabetes Brother   . Hypertension Maternal Aunt   . Heart disease Maternal Grandmother   . Hypertension Maternal Grandmother   . Diabetes Maternal Grandmother   . Kidney disease Maternal Grandmother     Social History  Substance Use Topics  . Smoking status: Never Smoker  . Smokeless tobacco: Never Used  . Alcohol use No    Allergies: No Known Allergies  Prescriptions Prior to Admission  Medication Sig Dispense Refill Last Dose  . calcium carbonate (TUMS - DOSED IN MG ELEMENTAL CALCIUM) 500 MG chewable tablet Chew 4 tablets by mouth daily as needed for indigestion or heartburn.   Taking  . folic acid (FOLVITE) 800 MCG tablet Take 800 mcg by  mouth at bedtime.   Taking  . Prenatal Vit-Fe Fumarate-FA (PRENATAL MULTIVITAMIN) TABS Take 1 tablet by mouth at bedtime.    Taking  . progesterone (PROMETRIUM) 200 MG capsule Place 1 capsule (200 mg total) vaginally at bedtime. 30 capsule 2 Taking    Review of Systems  Gastrointestinal: Positive for abdominal pain.  Genitourinary: Positive for vaginal discharge. Negative for vaginal bleeding.   Physical Exam   Blood pressure (!) 114/57, pulse 83, temperature 97.7 F (36.5 C), temperature source Oral, resp. rate 16, height  (1.626 m), weight 234 lb (106.1 kg), last menstrual period 07/03/2016, SpO2 99 %.  Physical Exam  Constitutional: She is oriented to person, place, and time. She appears well-developed and well-nourished. No distress.  HENT:  Head: Normocephalic and atraumatic.  Neck: Normal range of motion.  Cardiovascular: Normal rate.   Respiratory: Effort normal.  GI: Soft. She exhibits no distension. There is no tenderness.  gravid  Musculoskeletal: Normal range of motion.  Neurological: She is alert and oriented to person, place, and time.  Skin: Skin is warm and dry.  Psychiatric: She has a normal mood and affect.   EFM-A: 140 bpm, mod variability, + accels, no decels EFM-B: 145 bpm, mod  variability, + accels, no decels Toco: 2-3  Results for orders placed or performed during the hospital encounter of 01/28/17 (from the past 24 hour(s))  Urinalysis, Routine w reflex microscopic     Status: Abnormal   Collection Time: 01/28/17  4:50 PM  Result Value Ref Range   Color, Urine YELLOW YELLOW   APPearance CLEAR CLEAR   Specific Gravity, Urine 1.019 1.005 - 1.030   pH 6.0 5.0 - 8.0   Glucose, UA NEGATIVE NEGATIVE mg/dL   Hgb urine dipstick NEGATIVE NEGATIVE   Bilirubin Urine NEGATIVE NEGATIVE   Ketones, ur 20 (A) NEGATIVE mg/dL   Protein, ur 30 (A) NEGATIVE mg/dL   Nitrite NEGATIVE NEGATIVE   Leukocytes, UA NEGATIVE NEGATIVE   RBC / HPF 0-5 0 - 5 RBC/hpf   WBC,  UA 0-5 0 - 5 WBC/hpf   Bacteria, UA RARE (A) NONE SEEN   Squamous Epithelial / LPF 0-5 (A) NONE SEEN   Mucus PRESENT    Ca Oxalate Crys, UA PRESENT   Wet prep, genital     Status: Abnormal   Collection Time: 01/28/17  5:45 PM  Result Value Ref Range   Yeast Wet Prep HPF POC NONE SEEN NONE SEEN   Trich, Wet Prep NONE SEEN NONE SEEN   Clue Cells Wet Prep HPF POC NONE SEEN NONE SEEN   WBC, Wet Prep HPF POC MODERATE (A) NONE SEEN   Sperm NONE SEEN   Amnisure rupture of membrane (rom)not at [facility]     Status: None   Collection Time: 01/28/17  6:31 PM  Result Value Ref Range   Amnisure ROM NEGATIVE    MAU Course  Procedures LR 1 L Procardia  MDM Labs ordered and reviewed. No cervical change. Ctx resolved. No evidence of PTL. Presentation, clinical findings, and plan discussed with Dr. Chestine Spore. Stable for discharge home.  Assessment and Plan  29 week didi twin pregnancy Reactive NST Preterm contractions  Discharge home Follow up in OB office next week as schedued PTL precautions Rx Procardia prn  Allergies as of 01/28/2017   No Known Allergies     Medication List    TAKE these medications   calcium carbonate 500 MG chewable tablet Commonly known as:  TUMS - dosed in mg elemental calcium Chew 4 tablets by mouth daily as needed for indigestion or heartburn.   folic acid 800 MCG tablet Commonly known as:  FOLVITE Take 800 mcg by mouth at bedtime.   NIFEdipine 10 MG capsule Commonly known as:  PROCARDIA Take 1 capsule (10 mg total) by mouth every 6 (six) hours as needed. As needed for contractions   prenatal multivitamin Tabs tablet Take 1 tablet by mouth at bedtime.   progesterone 200 MG capsule Commonly known as:  PROMETRIUM Place 1 capsule (200 mg total) vaginally at bedtime.            Discharge Care Instructions        Start     Ordered   01/28/17 0000  Discharge patient    Question Answer Comment  Discharge disposition 01-Home or Self Care   Discharge  patient date 01/28/2017      01/28/17 1934   01/28/17 0000  NIFEdipine (PROCARDIA) 10 MG capsule  Every 6 hours PRN    Question:  Supervising Provider  Answer:  Tereso Newcomer   01/28/17 1934     Donette Larry, CNM 01/28/2017, 7:36 PM

## 2017-01-28 NOTE — MAU Note (Signed)
Pt reports contractions off/on since her discharge from the hospital , denies bleeding or ROM

## 2017-01-29 LAB — GC/CHLAMYDIA PROBE AMP (~~LOC~~) NOT AT ARMC
Chlamydia: NEGATIVE
Neisseria Gonorrhea: NEGATIVE

## 2017-02-18 DIAGNOSIS — O30009 Twin pregnancy, unspecified number of placenta and unspecified number of amniotic sacs, unspecified trimester: Secondary | ICD-10-CM | POA: Diagnosis not present

## 2017-02-18 DIAGNOSIS — O26849 Uterine size-date discrepancy, unspecified trimester: Secondary | ICD-10-CM | POA: Diagnosis not present

## 2017-02-19 ENCOUNTER — Encounter (HOSPITAL_COMMUNITY): Payer: Self-pay

## 2017-02-19 ENCOUNTER — Ambulatory Visit (HOSPITAL_COMMUNITY)
Admission: RE | Admit: 2017-02-19 | Discharge: 2017-02-19 | Disposition: A | Discharge status: Home / Self Care | Payer: 59 | Source: Ambulatory Visit | Attending: Obstetrics and Gynecology | Admitting: Obstetrics and Gynecology

## 2017-02-19 MED FILL — NIFEdipine 10 MG CAPS: 10 | 15 days supply | Qty: 60 | Fill #0

## 2017-03-02 DIAGNOSIS — O30041 Twin pregnancy, dichorionic/diamniotic, first trimester: Secondary | ICD-10-CM | POA: Diagnosis not present

## 2017-03-02 DIAGNOSIS — O329XX9 Maternal care for malpresentation of fetus, unspecified, other fetus: Secondary | ICD-10-CM | POA: Diagnosis not present

## 2017-03-02 MED FILL — PROGESTERONE 200 MG CAPSULE: 200 | 30 days supply | Qty: 30 | Fill #0

## 2017-03-03 ENCOUNTER — Other Ambulatory Visit: Payer: Self-pay | Admitting: Obstetrics and Gynecology

## 2017-03-11 DIAGNOSIS — Z3480 Encounter for supervision of other normal pregnancy, unspecified trimester: Secondary | ICD-10-CM | POA: Diagnosis not present

## 2017-03-11 LAB — OB RESULTS CONSOLE GBS: STREP GROUP B AG: NEGATIVE

## 2017-03-15 ENCOUNTER — Telehealth (HOSPITAL_COMMUNITY): Payer: Self-pay | Admitting: *Deleted

## 2017-03-15 ENCOUNTER — Encounter (HOSPITAL_COMMUNITY): Payer: Self-pay

## 2017-03-15 NOTE — Telephone Encounter (Signed)
Preadmission screen  

## 2017-03-16 DIAGNOSIS — O09523 Supervision of elderly multigravida, third trimester: Secondary | ICD-10-CM | POA: Diagnosis not present

## 2017-03-17 ENCOUNTER — Encounter (HOSPITAL_COMMUNITY): Payer: Self-pay

## 2017-03-18 ENCOUNTER — Other Ambulatory Visit: Payer: Self-pay | Admitting: Obstetrics and Gynecology

## 2017-03-18 DIAGNOSIS — O30043 Twin pregnancy, dichorionic/diamniotic, third trimester: Secondary | ICD-10-CM | POA: Diagnosis not present

## 2017-03-18 NOTE — H&P (Addendum)
36 y.o.  G2P1001 4038w6d comes in for a cesarean section at term, twins with footling breech and advanced cervical dilation.  Patient has good fetal movement and no bleeding.   Past Medical History:  Diagnosis Date  . Gestational diabetes   . H/O varicella   . History of chlamydia   . HPV in female   . 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 Newborn product of in vitro fertilization (IVF) pregnancy     Past Surgical History:  Procedure Laterality Date  . CHOLECYSTECTOMY    . COLPOSCOPY    . invitrofertilization    . LAPAROSCOPIC GASTRIC BAND REMOVAL WITH LAPAROSCOPIC GASTRIC SLEEVE RESECTION  2017  . LEEP    . WISDOM TOOTH EXTRACTION      OB History  Gravida Para Term Preterm AB Living  2 1 1  0 0 1  SAB TAB Ectopic Multiple Live Births  0 0 0 0 1    # Outcome Date GA Lbr Len/2nd Weight Sex Delivery Anes PTL Lv  2 Current           1 Term 10/25/11 4739w2d 04:04 / 00:36 8 lb 15 oz (4.055 kg) F Vag-Spont EPI  LIV     Birth Comments: Facial Bruising       Social History   Socioeconomic History  . Marital status: Married    Spouse name: Not on file  . Number of children: Not on file  . Years of education: Not on file  . Highest education level: Not on file  Social Needs  . Financial resource strain: Not on file  . Food insecurity - worry: Not on file  . Food insecurity - inability: Not on file  . Transportation needs - medical: Not on file  . Transportation needs - non-medical: Not on file  Occupational History  . Not on file  Tobacco Use  . Smoking status: Never Smoker  . Smokeless tobacco: Never Used  Substance and Sexual Activity  . Alcohol use: No  . Drug use: No  . Sexual activity: Yes  Other Topics Concern  . Not on file  Social History Narrative  . Not on file   Patient has no known allergies.   Prenatal Course: uncomplicated twin di/di pregnancy.   Prenatal Transfer Tool  Maternal Diabetes: No Genetic Screening: Declined Maternal Ultrasounds/Referrals: Normal-  concordant but twin A breech Fetal Ultrasounds or other Referrals:  None Maternal Substance Abuse:  No Significant Maternal Medications:  None Significant Maternal Lab Results: None  There were no vitals filed for this visit.  Lungs/Cor:  NAD Abdomen:  soft, gravid Ex:  no cords, erythema SVE:  3/C/presenting part not in pelvis FHTs:  Present x 2  A/P   For cesarean section at term for breech Twin A footling with advanced dilation.  D/w MFM on phone and they agree that risks of prolapsed cord are high and it is reasonable to proceed with delivery now. All risks, benefits and alternatives discussed with patient and she desires to proceed.  Jafet Wissing A

## 2017-03-19 ENCOUNTER — Inpatient Hospital Stay (HOSPITAL_COMMUNITY): Payer: 59 | Admitting: Anesthesiology

## 2017-03-19 ENCOUNTER — Inpatient Hospital Stay (HOSPITAL_COMMUNITY)
Admission: RE | Admit: 2017-03-19 | Discharge: 2017-03-21 | DRG: 786 | Disposition: A | Discharge status: Home / Self Care | Payer: 59 | Source: Ambulatory Visit | Attending: Obstetrics and Gynecology | Admitting: Obstetrics and Gynecology

## 2017-03-19 ENCOUNTER — Encounter (HOSPITAL_COMMUNITY): Payer: Self-pay | Admitting: *Deleted

## 2017-03-19 ENCOUNTER — Other Ambulatory Visit: Payer: Self-pay

## 2017-03-19 ENCOUNTER — Encounter (HOSPITAL_COMMUNITY)
Admission: RE | Disposition: A | Discharge status: Home / Self Care | Payer: Self-pay | Source: Ambulatory Visit | Attending: Obstetrics and Gynecology

## 2017-03-19 ENCOUNTER — Ambulatory Visit (HOSPITAL_COMMUNITY): Payer: Self-pay

## 2017-03-19 DIAGNOSIS — O321XX1 Maternal care for breech presentation, fetus 1: Principal | ICD-10-CM | POA: Diagnosis present

## 2017-03-19 DIAGNOSIS — O30009 Twin pregnancy, unspecified number of placenta and unspecified number of amniotic sacs, unspecified trimester: Secondary | ICD-10-CM | POA: Diagnosis not present

## 2017-03-19 DIAGNOSIS — Z3A36 36 weeks gestation of pregnancy: Secondary | ICD-10-CM

## 2017-03-19 DIAGNOSIS — O30003 Twin pregnancy, unspecified number of placenta and unspecified number of amniotic sacs, third trimester: Secondary | ICD-10-CM | POA: Diagnosis present

## 2017-03-19 DIAGNOSIS — Z9889 Other specified postprocedural states: Secondary | ICD-10-CM

## 2017-03-19 DIAGNOSIS — O321XX2 Maternal care for breech presentation, fetus 2: Secondary | ICD-10-CM | POA: Diagnosis present

## 2017-03-19 DIAGNOSIS — Z3A37 37 weeks gestation of pregnancy: Secondary | ICD-10-CM | POA: Diagnosis not present

## 2017-03-19 DIAGNOSIS — O321XX0 Maternal care for breech presentation, not applicable or unspecified: Secondary | ICD-10-CM | POA: Diagnosis not present

## 2017-03-19 DIAGNOSIS — Z3A00 Weeks of gestation of pregnancy not specified: Secondary | ICD-10-CM | POA: Diagnosis not present

## 2017-03-19 DIAGNOSIS — O30043 Twin pregnancy, dichorionic/diamniotic, third trimester: Secondary | ICD-10-CM | POA: Diagnosis not present

## 2017-03-19 LAB — CBC
HCT: 37.5 % (ref 36.0–46.0)
HEMOGLOBIN: 12.6 g/dL (ref 12.0–15.0)
MCH: 28.6 pg (ref 26.0–34.0)
MCHC: 33.6 g/dL (ref 30.0–36.0)
MCV: 85 fL (ref 78.0–100.0)
PLATELETS: 158 10*3/uL (ref 150–400)
RBC: 4.41 MIL/uL (ref 3.87–5.11)
RDW: 14.6 % (ref 11.5–15.5)
WBC: 7.4 10*3/uL (ref 4.0–10.5)

## 2017-03-19 SURGERY — Surgical Case
Anesthesia: Spinal

## 2017-03-19 MED ORDER — BISACODYL 10 MG RE SUPP: 10.0000 mg | Freq: Every day | RECTAL | Status: DC | PRN

## 2017-03-19 MED ORDER — LACTATED RINGERS IV SOLN: INTRAVENOUS | Status: DC

## 2017-03-19 MED ORDER — NALBUPHINE HCL 10 MG/ML IJ SOLN: 5.0000 mg | INTRAMUSCULAR | Status: DC | PRN

## 2017-03-19 MED ORDER — SODIUM CHLORIDE 0.9 % IR SOLN
Status: DC | PRN
Start: 2017-03-19 — End: 2017-03-19
  Administered 2017-03-19 (×2): 1

## 2017-03-19 MED ORDER — NALOXONE HCL 0.4 MG/ML IJ SOLN: 0.4000 mg | INTRAMUSCULAR | Status: DC | PRN

## 2017-03-19 MED ORDER — SCOPOLAMINE 1 MG/3DAYS TD PT72
MEDICATED_PATCH | TRANSDERMAL | Status: AC
  Filled 2017-03-19: qty 1

## 2017-03-19 MED ORDER — DEXAMETHASONE SODIUM PHOSPHATE 4 MG/ML IJ SOLN
INTRAMUSCULAR | Status: DC | PRN
  Administered 2017-03-19: 4 mg via INTRAVENOUS

## 2017-03-19 MED ORDER — METHYLERGONOVINE MALEATE 0.2 MG/ML IJ SOLN: 0.2000 mg | INTRAMUSCULAR | Status: DC | PRN

## 2017-03-19 MED ORDER — DIPHENHYDRAMINE HCL 50 MG/ML IJ SOLN: 12.5000 mg | INTRAMUSCULAR | Status: DC | PRN

## 2017-03-19 MED ORDER — NALOXONE HCL 0.4 MG/ML IJ SOLN
1.0000 ug/kg/h | INTRAVENOUS | Status: DC | PRN
  Filled 2017-03-19: qty 5

## 2017-03-19 MED ORDER — ACETAMINOPHEN 325 MG PO TABS
650.0000 mg | ORAL_TABLET | ORAL | Status: DC | PRN
  Filled 2017-03-19: qty 2

## 2017-03-19 MED ORDER — PROMETHAZINE HCL 25 MG/ML IJ SOLN: 6.2500 mg | INTRAMUSCULAR | Status: DC | PRN

## 2017-03-19 MED ORDER — SENNOSIDES-DOCUSATE SODIUM 8.6-50 MG PO TABS
2.0000 | ORAL_TABLET | ORAL | Status: DC
  Administered 2017-03-20 (×2): 2 via ORAL
  Filled 2017-03-19 (×2): qty 2

## 2017-03-19 MED ORDER — ONDANSETRON HCL 4 MG/2ML IJ SOLN
INTRAMUSCULAR | Status: DC | PRN
  Administered 2017-03-19: 4 mg via INTRAVENOUS

## 2017-03-19 MED ORDER — FENTANYL CITRATE (PF) 100 MCG/2ML IJ SOLN
INTRAMUSCULAR | Status: DC | PRN
  Administered 2017-03-19: 10 ug via INTRATHECAL

## 2017-03-19 MED ORDER — SOD CITRATE-CITRIC ACID 500-334 MG/5ML PO SOLN: 30.0000 mL | Freq: Once | ORAL | Status: DC

## 2017-03-19 MED ORDER — FENTANYL CITRATE (PF) 100 MCG/2ML IJ SOLN
INTRAMUSCULAR | Status: AC
  Filled 2017-03-19: qty 2

## 2017-03-19 MED ORDER — SIMETHICONE 80 MG PO CHEW
80.0000 mg | CHEWABLE_TABLET | Freq: Three times a day (TID) | ORAL | Status: DC
  Administered 2017-03-19 – 2017-03-21 (×4): 80 mg via ORAL
  Filled 2017-03-19 (×5): qty 1

## 2017-03-19 MED ORDER — OXYTOCIN 10 UNIT/ML IJ SOLN
INTRAMUSCULAR | Status: AC
  Filled 2017-03-19: qty 4

## 2017-03-19 MED ORDER — OXYCODONE HCL 5 MG PO TABS: 10.0000 mg | ORAL_TABLET | ORAL | Status: DC | PRN

## 2017-03-19 MED ORDER — IBUPROFEN 800 MG PO TABS
800.0000 mg | ORAL_TABLET | Freq: Three times a day (TID) | ORAL | Status: DC
  Administered 2017-03-19 – 2017-03-21 (×6): 800 mg via ORAL
  Filled 2017-03-19 (×6): qty 1

## 2017-03-19 MED ORDER — ZOLPIDEM TARTRATE 5 MG PO TABS: 5.0000 mg | ORAL_TABLET | Freq: Every evening | ORAL | Status: DC | PRN

## 2017-03-19 MED ORDER — OXYCODONE HCL 5 MG PO TABS
5.0000 mg | ORAL_TABLET | ORAL | Status: DC | PRN
  Administered 2017-03-20 – 2017-03-21 (×5): 5 mg via ORAL
  Filled 2017-03-19 (×5): qty 1

## 2017-03-19 MED ORDER — MEPERIDINE HCL 25 MG/ML IJ SOLN: 6.2500 mg | INTRAMUSCULAR | Status: DC | PRN

## 2017-03-19 MED ORDER — PRENATAL MULTIVITAMIN CH
1.0000 | ORAL_TABLET | Freq: Every day | ORAL | Status: DC
  Administered 2017-03-20: 1 via ORAL
  Filled 2017-03-19: qty 1

## 2017-03-19 MED ORDER — PHENYLEPHRINE 8 MG IN D5W 100 ML (0.08MG/ML) PREMIX OPTIME
INJECTION | INTRAVENOUS | Status: AC
  Filled 2017-03-19: qty 100

## 2017-03-19 MED ORDER — FENTANYL CITRATE (PF) 100 MCG/2ML IJ SOLN: 25.0000 ug | INTRAMUSCULAR | Status: DC | PRN

## 2017-03-19 MED ORDER — OXYTOCIN 40 UNITS IN LACTATED RINGERS INFUSION - SIMPLE MED: 2.5000 [IU]/h | INTRAVENOUS | Status: AC

## 2017-03-19 MED ORDER — PHENYLEPHRINE 8 MG IN D5W 100 ML (0.08MG/ML) PREMIX OPTIME
INJECTION | INTRAVENOUS | Status: DC | PRN
  Administered 2017-03-19: 60 ug/min via INTRAVENOUS

## 2017-03-19 MED ORDER — OXYTOCIN 10 UNIT/ML IJ SOLN
INTRAVENOUS | Status: DC | PRN
  Administered 2017-03-19: 40 [IU] via INTRAVENOUS

## 2017-03-19 MED ORDER — NALBUPHINE HCL 10 MG/ML IJ SOLN: 5.0000 mg | Freq: Once | INTRAMUSCULAR | Status: DC | PRN

## 2017-03-19 MED ORDER — FLEET ENEMA 7-19 GM/118ML RE ENEM: 1.0000 | ENEMA | Freq: Every day | RECTAL | Status: DC | PRN

## 2017-03-19 MED ORDER — SCOPOLAMINE 1 MG/3DAYS TD PT72
1.0000 | MEDICATED_PATCH | Freq: Once | TRANSDERMAL | Status: DC
  Administered 2017-03-19: 1.5 mg via TRANSDERMAL

## 2017-03-19 MED ORDER — SODIUM CHLORIDE 0.9% FLUSH: 3.0000 mL | INTRAVENOUS | Status: DC | PRN

## 2017-03-19 MED ORDER — KETOROLAC TROMETHAMINE 30 MG/ML IJ SOLN
INTRAMUSCULAR | Status: AC
  Filled 2017-03-19: qty 1

## 2017-03-19 MED ORDER — LACTATED RINGERS IV SOLN
INTRAVENOUS | Status: DC | PRN
  Administered 2017-03-19: 09:00:00 via INTRAVENOUS

## 2017-03-19 MED ORDER — DEXAMETHASONE SODIUM PHOSPHATE 4 MG/ML IJ SOLN
INTRAMUSCULAR | Status: AC
  Filled 2017-03-19: qty 1

## 2017-03-19 MED ORDER — ONDANSETRON HCL 4 MG/2ML IJ SOLN
INTRAMUSCULAR | Status: AC
  Filled 2017-03-19: qty 2

## 2017-03-19 MED ORDER — MEASLES, MUMPS & RUBELLA VAC ~~LOC~~ INJ
0.5000 mL | INJECTION | Freq: Once | SUBCUTANEOUS | Status: DC
  Filled 2017-03-19: qty 0.5

## 2017-03-19 MED ORDER — METHYLERGONOVINE MALEATE 0.2 MG PO TABS: 0.2000 mg | ORAL_TABLET | ORAL | Status: DC | PRN

## 2017-03-19 MED ORDER — DIPHENHYDRAMINE HCL 25 MG PO CAPS: 25.0000 mg | ORAL_CAPSULE | Freq: Four times a day (QID) | ORAL | Status: DC | PRN

## 2017-03-19 MED ORDER — LACTATED RINGERS IV SOLN
INTRAVENOUS | Status: DC
  Administered 2017-03-19 (×2): via INTRAVENOUS

## 2017-03-19 MED ORDER — FERROUS SULFATE 325 (65 FE) MG PO TABS
325.0000 mg | ORAL_TABLET | Freq: Two times a day (BID) | ORAL | Status: DC
  Administered 2017-03-19 – 2017-03-21 (×3): 325 mg via ORAL
  Filled 2017-03-19 (×3): qty 1

## 2017-03-19 MED ORDER — CEFAZOLIN SODIUM-DEXTROSE 2-4 GM/100ML-% IV SOLN
2.0000 g | INTRAVENOUS | Status: AC
  Administered 2017-03-19: 2 g via INTRAVENOUS
  Filled 2017-03-19 (×2): qty 100

## 2017-03-19 MED ORDER — WITCH HAZEL-GLYCERIN EX PADS: 1.0000 "application " | MEDICATED_PAD | CUTANEOUS | Status: DC | PRN

## 2017-03-19 MED ORDER — MENTHOL 3 MG MT LOZG: 1.0000 | LOZENGE | OROMUCOSAL | Status: DC | PRN

## 2017-03-19 MED ORDER — COCONUT OIL OIL
1.0000 | TOPICAL_OIL | Status: DC | PRN
Start: 2017-03-19 — End: 2017-03-21

## 2017-03-19 MED ORDER — MORPHINE SULFATE (PF) 0.5 MG/ML IJ SOLN
INTRAMUSCULAR | Status: DC | PRN
  Administered 2017-03-19: .2 mg via INTRATHECAL

## 2017-03-19 MED ORDER — DIPHENHYDRAMINE HCL 25 MG PO CAPS: 25.0000 mg | ORAL_CAPSULE | ORAL | Status: DC | PRN

## 2017-03-19 MED ORDER — SIMETHICONE 80 MG PO CHEW: 80.0000 mg | CHEWABLE_TABLET | ORAL | Status: DC | PRN

## 2017-03-19 MED ORDER — DIBUCAINE 1 % RE OINT: 1.0000 "application " | TOPICAL_OINTMENT | RECTAL | Status: DC | PRN

## 2017-03-19 MED ORDER — KETOROLAC TROMETHAMINE 30 MG/ML IJ SOLN: 30.0000 mg | Freq: Four times a day (QID) | INTRAMUSCULAR | Status: DC | PRN

## 2017-03-19 MED ORDER — KETOROLAC TROMETHAMINE 30 MG/ML IJ SOLN
30.0000 mg | Freq: Four times a day (QID) | INTRAMUSCULAR | Status: DC | PRN
  Administered 2017-03-19: 30 mg via INTRAMUSCULAR

## 2017-03-19 MED ORDER — TETANUS-DIPHTH-ACELL PERTUSSIS 5-2.5-18.5 LF-MCG/0.5 IM SUSP
0.5000 mL | Freq: Once | INTRAMUSCULAR | Status: DC
Start: 2017-03-20 — End: 2017-03-21

## 2017-03-19 MED ORDER — MORPHINE SULFATE (PF) 0.5 MG/ML IJ SOLN
INTRAMUSCULAR | Status: AC
  Filled 2017-03-19: qty 10

## 2017-03-19 MED ORDER — ONDANSETRON HCL 4 MG/2ML IJ SOLN: 4.0000 mg | Freq: Three times a day (TID) | INTRAMUSCULAR | Status: DC | PRN

## 2017-03-19 MED ORDER — SIMETHICONE 80 MG PO CHEW
80.0000 mg | CHEWABLE_TABLET | ORAL | Status: DC
  Administered 2017-03-20 (×2): 80 mg via ORAL
  Filled 2017-03-19 (×2): qty 1

## 2017-03-19 MED ORDER — BUPIVACAINE IN DEXTROSE 0.75-8.25 % IT SOLN
INTRATHECAL | Status: DC | PRN
  Administered 2017-03-19: 1.6 mL via INTRATHECAL

## 2017-03-19 SURGICAL SUPPLY — 39 items
BENZOIN TINCTURE PRP APPL 2/3 (GAUZE/BANDAGES/DRESSINGS) ×3 IMPLANT
BLADE SURG 10 STRL SS (BLADE) ×3 IMPLANT
CHLORAPREP W/TINT 26ML (MISCELLANEOUS) ×3 IMPLANT
CLAMP CORD UMBIL (MISCELLANEOUS) IMPLANT
CLOSURE STERI STRIP 1/2 X4 (GAUZE/BANDAGES/DRESSINGS) ×3 IMPLANT
CLOTH BEACON ORANGE TIMEOUT ST (SAFETY) ×3 IMPLANT
DRSG OPSITE POSTOP 4X10 (GAUZE/BANDAGES/DRESSINGS) ×3 IMPLANT
ELECT REM PT RETURN 9FT ADLT (ELECTROSURGICAL) ×3
ELECTRODE REM PT RTRN 9FT ADLT (ELECTROSURGICAL) ×1 IMPLANT
EXTRACTOR VACUUM BELL STYLE (SUCTIONS) IMPLANT
GLOVE BIO SURGEON STRL SZ7 (GLOVE) ×3 IMPLANT
GLOVE BIOGEL PI IND STRL 7.0 (GLOVE) ×2 IMPLANT
GLOVE BIOGEL PI INDICATOR 7.0 (GLOVE) ×4
GOWN STRL REUS W/TWL LRG LVL3 (GOWN DISPOSABLE) ×9 IMPLANT
HOVERMATT SINGLE USE (MISCELLANEOUS) ×3 IMPLANT
KIT ABG SYR 3ML LUER SLIP (SYRINGE) IMPLANT
NDL SAFETY ECLIPSE 18X1.5 (NEEDLE) ×1 IMPLANT
NEEDLE HYPO 18GX1.5 BLUNT FILL (NEEDLE) ×3 IMPLANT
NEEDLE HYPO 18GX1.5 SHARP (NEEDLE) ×2
NEEDLE HYPO 25X5/8 SAFETYGLIDE (NEEDLE) IMPLANT
NS IRRIG 1000ML POUR BTL (IV SOLUTION) ×3 IMPLANT
PACK C SECTION WH (CUSTOM PROCEDURE TRAY) ×3 IMPLANT
PAD OB MATERNITY 4.3X12.25 (PERSONAL CARE ITEMS) ×3 IMPLANT
PENCIL SMOKE EVAC W/HOLSTER (ELECTROSURGICAL) ×3 IMPLANT
RTRCTR C-SECT PINK 25CM LRG (MISCELLANEOUS) ×3 IMPLANT
STRIP CLOSURE SKIN 1/2X4 (GAUZE/BANDAGES/DRESSINGS) ×2 IMPLANT
SUT MNCRL 0 VIOLET CTX 36 (SUTURE) ×2 IMPLANT
SUT MONOCRYL 0 CTX 36 (SUTURE) ×4
SUT PDS AB 0 CTX 60 (SUTURE) IMPLANT
SUT PLAIN 2 0 XLH (SUTURE) IMPLANT
SUT VIC AB 0 CT1 27 (SUTURE) ×6
SUT VIC AB 0 CT1 27XBRD ANBCTR (SUTURE) ×3 IMPLANT
SUT VIC AB 2-0 CT1 27 (SUTURE) ×2
SUT VIC AB 2-0 CT1 TAPERPNT 27 (SUTURE) ×1 IMPLANT
SUT VIC AB 4-0 KS 27 (SUTURE) ×3 IMPLANT
SYR BULB 3OZ (MISCELLANEOUS) ×3 IMPLANT
SYRINGE 10CC LL (SYRINGE) ×3 IMPLANT
TOWEL OR 17X24 6PK STRL BLUE (TOWEL DISPOSABLE) ×3 IMPLANT
TRAY FOLEY BAG SILVER LF 14FR (SET/KITS/TRAYS/PACK) ×3 IMPLANT

## 2017-03-19 NOTE — Anesthesia Preprocedure Evaluation (Addendum)
Anesthesia Evaluation  Patient identified by MRN, date of birth, ID band Patient awake    Reviewed: Allergy & Precautions, NPO status , Patient's Chart, lab work & pertinent test results  Airway Mallampati: I  TM Distance: >3 FB Neck ROM: Full    Dental  (+) Teeth Intact, Dental Advisory Given   Pulmonary neg pulmonary ROS,    breath sounds clear to auscultation       Cardiovascular negative cardio ROS   Rhythm:Regular Rate:Normal     Neuro/Psych negative neurological ROS     GI/Hepatic negative GI ROS, Neg liver ROS,   Endo/Other  diabetes, Gestational  Renal/GU negative Renal ROS     Musculoskeletal negative musculoskeletal ROS (+)   Abdominal   Peds  Hematology negative hematology ROS (+)   Anesthesia Other Findings Day of surgery medications reviewed with the patient.  Reproductive/Obstetrics (+) Pregnancy                            Lab Results  Component Value Date   WBC 7.4 03/19/2017   HGB 12.6 03/19/2017   HCT 37.5 03/19/2017   MCV 85.0 03/19/2017   PLT 158 03/19/2017   No results found for: INR, PROTIME   Anesthesia Physical Anesthesia Plan  ASA: III  Anesthesia Plan: Spinal   Post-op Pain Management:    Induction:   PONV Risk Score and Plan:   Airway Management Planned: Natural Airway  Additional Equipment:   Intra-op Plan:   Post-operative Plan:   Informed Consent: I have reviewed the patients History and Physical, chart, labs and discussed the procedure including the risks, benefits and alternatives for the proposed anesthesia with the patient or authorized representative who has indicated his/her understanding and acceptance.     Plan Discussed with: CRNA  Anesthesia Plan Comments:         Anesthesia Quick Evaluation

## 2017-03-19 NOTE — Anesthesia Postprocedure Evaluation (Signed)
Anesthesia Post Note  Patient: Jennifer GaskinsJennifer D Mccormick  Procedure(s) Performed: CESAREAN SECTION MULTI-GESTATIONAL (N/A )     Patient location during evaluation: Mother Baby Anesthesia Type: Spinal Level of consciousness: awake Pain management: pain level controlled Vital Signs Assessment: post-procedure vital signs reviewed and stable Respiratory status: spontaneous breathing Cardiovascular status: stable Postop Assessment: no headache, patient able to bend at knees and spinal receding Anesthetic complications: no    Last Vitals:  Vitals:   03/19/17 1330 03/19/17 1426  BP: 130/67 120/62  Pulse: 65 61  Resp: 16 16  Temp: 36.7 C 36.8 C  SpO2:  96%    Last Pain:  Vitals:   03/19/17 1426  TempSrc: Oral   Pain Goal:                 Edison PaceWILKERSON,Norberta Stobaugh

## 2017-03-19 NOTE — Addendum Note (Signed)
Addendum  created 03/19/17 1441 by Earmon PhoenixWilkerson, Kree Rafter P, CRNA   Sign clinical note

## 2017-03-19 NOTE — Brief Op Note (Signed)
03/19/2017  9:04 AM  PATIENT:  Jennifer Mccormick  35 y.o. female  PRE-OPERATIVE DIAGNOSIS:  TWIN Mccormick BREECH  POST-OPERATIVE DIAGNOSIS:  PRIMARY CESAREAN SECTION TWIN Mccormick BREECH   PROCEDURE:  Procedure(s): CESAREAN SECTION MULTI-GESTATIONAL (N/Mccormick)  SURGEON:  Surgeon(s) and Role:    * Imagine Nest, MD - Primary    * Clark, Dyanna, MD - Assisting  ANESTHESIA:   spinal  EBL:  881 mL    SPECIMEN:  Source of Specimen:  placenta  DISPOSITION OF SPECIMEN:  PATHOLOGY  COUNTS:  YES  TOURNIQUET:  * No tourniquets in log *  DICTATION: .Note written in EPIC  PLAN OF CARE: Admit to inpatient   PATIENT DISPOSITION:  PACU - hemodynamically stable.   Delay start of Pharmacological VTE agent (>24hrs) due to surgical blood loss or risk of bleeding: not applicable  Complications:  none Medications:  Ancef, Pitocin Findings:  Baby Mccormick female breech,  Baby B breech, Apgars for both P, weights P.   Normal tubes, ovaries and uterus seen.  Babies were active and both did skin to skin with mother and father after birth in the OR.  Technique:  After adequate spinal anesthesia was achieved, the patient was prepped and draped in usual sterile fashion.  Mccormick foley catheter was used to drain the bladder.  Mccormick pfannanstiel incision was made with the scalpel and carried down to the fascia with the bovie cautery. The fascia was incised in the midline with the scalpel and carried in Mccormick transverse curvilinear manner bilaterally.  The fascia was reflected superiorly and inferiorly off the rectus muscles and the muscles split in the midline.  Mccormick bowel free portion of the peritoneum was entered bluntly and then extended in Mccormick superior and inferior manner with good visualization of the bowel and bladder.  The Alexis instrument was then placed and the vesico-uterine fascia tented up and incised in Mccormick transverse curvilinear manner.  Mccormick 2 cm transverse incision was made in the upper portion of the lower uterine segment until  the amnion was exposed.   The incision was extended transversely in Mccormick blunt manner.  Clear fluid was noted and Baby Mccormick delivered in the breech presentation without complication.  The baby was bulb suctioned and the cord was clamped and cut.  The baby was then handed to awaiting Neonatology.  Baby B came down in the breech presentation and was delivered and handed to Neonatology in same way. Cord bloods were obtained for both babies and B's placenta marked with Mccormick cord clamp. The placentas were then delivered manually and the uterus cleared of all debris.  The uterine incision was then closed with Mccormick running lock stitch of 0 monocryl.  An imbricating layer of 0 monocryl was closed as well. Excellent hemostasis of the uterine incision was achieved with an additional figure of eight stitch of 0 monocryl. and the abdomen was cleared with irrigation.  The peritoneum was closed with Mccormick running stitch of 2-0 vicryl.  This incorporated the rectus muscles as Mccormick separate layer.  The fascia was then closed with Mccormick running stitch of 0 vicryl.  The subcutaneous layer was closed with interrupted  stitches of 2-0 plain gut.  The skin was closed with 4-0 vicryl on Mccormick Keith needle and steri-strips.  The patient tolerated the procedure well and was returned to the recovery room in stable condition.  All counts were correct times three.  Jennifer Mccormick   

## 2017-03-19 NOTE — Lactation Note (Signed)
This note was copied from a baby's chart. Lactation Consultation Note  Patient Name: Jennifer JanskyGirlA Jennifer Mccormick WGNFA'OToday's Date: 03/19/2017 Reason for consult: Initial assessment;Multiple gestation;Early term 37-38.6wks;Other (Comment)(per mom plans only to pump and bottle feed , DEBP already set up and mom has pumped ) Twins - 8 hours ol.  Per mom has pumped and has been comfortable, did not need a review.  Per mom has no desire to latch babies at the breast.  Mother informed of post-discharge support and given phone number to the lactation department, including services for phone call assistance; out-patient appointments; and breastfeeding support group. List of other breastfeeding resources in the community given in the handout. Encouraged mother to call for problems or concerns related to breastfeeding.  Maternal Data Has patient been taught Hand Expression?: Yes(per mom remembers from her 1st baby ) Does the patient have breastfeeding experience prior to this delivery?: Yes  Feeding Feeding Type: Bottle Fed - Formula  LATCH Score                   Interventions Interventions: Breast feeding basics reviewed;DEBP  Lactation Tools Discussed/Used Tools: Pump Breast pump type: Double-Electric Breast Pump WIC Program: No Pump Review: (DEBP was already set up and mom has pumped )   Consult Status Consult Status: Follow-up(to ask what kind of DEBp she decided on ) Date: 03/20/17 Follow-up type: In-patient    Jennifer SprangMargaret Ann Journiee Mccormick 03/19/2017, 5:19 PM

## 2017-03-19 NOTE — Progress Notes (Signed)
There has been no change in the patients history, status or exam since the history and physical.  Vitals:   03/19/17 0537 03/19/17 0552  BP: (!) 157/91   Pulse: 93   Temp:  98.1 F (36.7 C)  TempSrc:  Oral  Weight: 244 lb (110.7 kg)   Height: 5\' 4"  (1.626 m)     Lab Results  Component Value Date   WBC 7.4 03/19/2017   HGB 12.6 03/19/2017   HCT 37.5 03/19/2017   MCV 85.0 03/19/2017   PLT 158 03/19/2017    Jennifer Mccormick A

## 2017-03-19 NOTE — Op Note (Signed)
03/19/2017  9:04 AM  PATIENT:  Lucia GaskinsJennifer D Templeton  36 y.o. female  PRE-OPERATIVE DIAGNOSIS:  TWIN A BREECH  POST-OPERATIVE DIAGNOSIS:  PRIMARY CESAREAN SECTION TWIN A BREECH   PROCEDURE:  Procedure(s): CESAREAN SECTION MULTI-GESTATIONAL (N/A)  SURGEON:  Surgeon(s) and Role:    * Carrington ClampHorvath, Nelson Julson, MD - Primary    * Marlow Baarslark, Dyanna, MD - Assisting  ANESTHESIA:   spinal  EBL:  881 mL    SPECIMEN:  Source of Specimen:  placenta  DISPOSITION OF SPECIMEN:  PATHOLOGY  COUNTS:  YES  TOURNIQUET:  * No tourniquets in log *  DICTATION: .Note written in EPIC  PLAN OF CARE: Admit to inpatient   PATIENT DISPOSITION:  PACU - hemodynamically stable.   Delay start of Pharmacological VTE agent (>24hrs) due to surgical blood loss or risk of bleeding: not applicable  Complications:  none Medications:  Ancef, Pitocin Findings:  Baby A female breech,  Baby B breech, Apgars for both P, weights P.   Normal tubes, ovaries and uterus seen.  Babies were active and both did [place]  Technique:  After adequate spinal anesthesia was achieved, the patient was prepped and draped in usual sterile fashion.  A foley catheter was used to drain the bladder.  A pfannanstiel incision was made with the scalpel and carried down to the fascia with the bovie cautery. The fascia was incised in the midline with the scalpel and carried in a transverse curvilinear manner bilaterally.  The fascia was reflected superiorly and inferiorly off the rectus muscles and the muscles split in the midline.  A bowel free portion of the peritoneum was entered bluntly and then extended in a superior and inferior manner with good visualization of the bowel and bladder.  The Alexis instrument was then placed and the vesico-uterine fascia tented up and incised in a transverse curvilinear manner.  A 2 cm transverse incision was made in the upper portion of the lower uterine segment until  the amnion was exposed.   The incision was extended transversely in a blunt manner.  Clear fluid was noted and Baby A delivered in the breech presentation without complication.  The baby was bulb suctioned and the cord was clamped and cut.  The baby was then handed to awaiting Neonatology.  Baby B came down in the breech presentation and was delivered and handed to Neonatology in same way. Cord bloods were obtained for both babies and B's placenta marked with a cord clamp. The placentas were then delivered manually and the uterus cleared of all debris.  The uterine incision was then closed with a running lock stitch of 0 monocryl.  An imbricating layer of 0 monocryl was closed as well. Excellent hemostasis of the uterine incision was achieved with an additional figure of eight stitch of 0 monocryl. and the abdomen was cleared with irrigation.  The peritoneum was closed with a running stitch of 2-0 vicryl.  This incorporated the rectus muscles as a separate layer.  The fascia was then closed with a running stitch of 0 vicryl.  The subcutaneous layer was closed with interrupted  stitches of 2-0 plain gut.  The skin was closed with 4-0 vicryl on a Keith needle and steri-strips.  The patient tolerated the procedure well and was returned to the recovery room in stable condition.  All counts were correct times three.  Jersey Espinoza A

## 2017-03-19 NOTE — Anesthesia Procedure Notes (Signed)
Spinal  Patient location during procedure: OR Start time: 03/19/2017 8:15 AM End time: 03/19/2017 8:17 AM Staffing Anesthesiologist: Effie Berkshire, MD Performed: anesthesiologist  Preanesthetic Checklist Completed: patient identified, site marked, surgical consent, pre-op evaluation, timeout performed, IV checked, risks and benefits discussed and monitors and equipment checked Spinal Block Patient position: sitting Prep: Betadine Patient monitoring: heart rate, continuous pulse ox, blood pressure and cardiac monitor Approach: midline Location: L4-5 Injection technique: single-shot Needle Needle type: Introducer and Pencan  Needle gauge: 24 G Needle length: 9 cm Additional Notes Negative paresthesia. Negative blood return. Positive free-flowing CSF. Expiration date of kit checked and confirmed. Patient tolerated procedure well, without complications.

## 2017-03-19 NOTE — Transfer of Care (Signed)
Immediate Anesthesia Transfer of Care Note  Patient: Lucia GaskinsJennifer D Leavens  Procedure(s) Performed: CESAREAN SECTION MULTI-GESTATIONAL (N/A )  Patient Location: PACU  Anesthesia Type:Spinal  Level of Consciousness: awake, alert , oriented and patient cooperative  Airway & Oxygen Therapy: Patient Spontanous Breathing  Post-op Assessment: Report given to RN  Post vital signs: stable  Last Vitals:  Vitals:   03/19/17 0537 03/19/17 0552  BP: (!) 157/91   Pulse: 93   Temp:  36.7 C    Last Pain:  Vitals:   03/19/17 0552  TempSrc: Oral         Complications: No apparent anesthesia complications

## 2017-03-19 NOTE — Anesthesia Postprocedure Evaluation (Signed)
Anesthesia Post Note  Patient: Jennifer GaskinsJennifer D Hue  Procedure(s) Performed: CESAREAN SECTION MULTI-GESTATIONAL (N/A )     Patient location during evaluation: PACU Anesthesia Type: Spinal Level of consciousness: oriented and awake and alert Pain management: pain level controlled Vital Signs Assessment: post-procedure vital signs reviewed and stable Respiratory status: spontaneous breathing, respiratory function stable and patient connected to nasal cannula oxygen Cardiovascular status: blood pressure returned to baseline and stable Postop Assessment: no headache, no backache, no apparent nausea or vomiting, spinal receding and patient able to bend at knees Anesthetic complications: no    Last Vitals:  Vitals:   03/19/17 1100 03/19/17 1127  BP: 104/73 122/64  Pulse: 72 65  Resp: 16 18  Temp: 36.4 C 36.4 C  SpO2: 98% 97%    Last Pain:  Vitals:   03/19/17 1127  TempSrc: Oral   Pain Goal:                 Shelton SilvasKevin D Truda Staub

## 2017-03-20 LAB — CBC
HCT: 30.6 % — ABNORMAL LOW (ref 36.0–46.0)
HEMOGLOBIN: 10.1 g/dL — AB (ref 12.0–15.0)
MCH: 28.6 pg (ref 26.0–34.0)
MCHC: 33 g/dL (ref 30.0–36.0)
MCV: 86.7 fL (ref 78.0–100.0)
PLATELETS: 160 10*3/uL (ref 150–400)
RBC: 3.53 MIL/uL — AB (ref 3.87–5.11)
RDW: 14.8 % (ref 11.5–15.5)
WBC: 9.2 10*3/uL (ref 4.0–10.5)

## 2017-03-20 MED ORDER — RHO D IMMUNE GLOBULIN 1500 UNIT/2ML IJ SOSY
300.0000 ug | PREFILLED_SYRINGE | Freq: Once | INTRAMUSCULAR | Status: AC
  Administered 2017-03-20: 300 ug via INTRAVENOUS
  Filled 2017-03-20: qty 2

## 2017-03-20 NOTE — Progress Notes (Signed)
POD#1 Pt without complaints. Lochia wnl VSSAF HGB-ok IMP/ Stable Plan/ Routine care

## 2017-03-21 LAB — RH IG WORKUP (INCLUDES ABO/RH)
ABO/RH(D): O NEG
Fetal Screen: NEGATIVE
GESTATIONAL AGE(WKS): 37
Unit division: 0

## 2017-03-21 MED ORDER — OXYCODONE HCL 5 MG PO TABS: 5.0000 mg | ORAL_TABLET | ORAL | 0 refills | Status: DC | PRN

## 2017-03-21 NOTE — Lactation Note (Signed)
This note was copied from a baby's chart. Lactation Consultation Note:Mother reports that baby girl A is breastfeeding well. She is able to hear infant swallow.  Mother reports that Baby Boy B is not latching at all. Mother is supplementing both infants with ebm formula.  Mother is a Pro[employer], [employer] Employee and request to get a Building services [employer] electric pump. Advised mother to continue to cue base feed and at least 8-12 times in 24 hours.  Mother to supplement infants according to supplemental guidelines.  Discussed treatment and prevention of engorgement and S/S of Mastitis. Mother receptive to all teaching. [facility]. Mother will phone to schedule an appt .   Patient Name: Jennifer JanskyGirlA Jennifer Mccormick ZOXWR'UToday's Date: 03/21/2017 Reason for consult: Follow-up assessment   Maternal Data    Feeding Feeding Type: Bottle Fed - Formula Length of feed: 25 min  LATCH Score Latch: Grasps breast easily, tongue down, lips flanged, rhythmical sucking.  Audible Swallowing: A few with stimulation  Type of Nipple: Flat  Comfort (Breast/Nipple): Soft / non-tender  Hold (Positioning): No assistance needed to correctly position infant at breast.  LATCH Score: 8  Interventions    Lactation Tools Discussed/Used     Consult Status Consult Status: Complete    Michel BickersKendrick, Calianna Kim McCoy 03/21/2017, 12:00 PM

## 2017-03-21 NOTE — Discharge Summary (Signed)
Obstetric Discharge Summary Reason for Admission: cesarean section twin preg, breech Prenatal Procedures: ultrasound Intrapartum Procedures: cesarean: low cervical, transverse Postpartum Procedures: none Complications-Operative and Postpartum: none Hemoglobin  Date Value Ref Range Status  03/20/2017 10.1 (L) 12.0 - 15.0 g/dL Final   HCT  Date Value Ref Range Status  03/20/2017 30.6 (L) 36.0 - 46.0 % Final    Physical Exam:  General: alert and cooperative Lochia: appropriate Uterine Fundus: firm Incision: healing well DVT Evaluation: No evidence of DVT seen on physical exam.  Discharge Diagnoses: Term Pregnancy-delivered and twin preg, Twin A Breech  Discharge Information: Date: 03/21/2017 Activity: pelvic rest Diet: routine Medications: PNV, Ibuprofen and Percocet Condition: stable Instructions: refer to practice specific booklet Discharge to: home Follow-up Information    Carrington ClampHorvath, Michelle, MD. Schedule an appointment as soon as possible for a visit in 1 month(s).   Specialty:  Obstetrics and Gynecology Contact information: [address] [place] [address] [phone]           Newborn Data:   Merrilee JanskyRowland, GirlA Amira [[id number]]  Live born female  Birth Weight: 6 lb 12.5 oz (3075 g) APGAR: 8, 9  Newborn Delivery   Birth date/time:  03/19/2017 08:34:00 Delivery type:  C-Section, Low Transverse C-section categorization:  Primary      Vincente PoliRowland, BoyB Cass [[id number]][[id number]]  Live born female  Birth Weight: 5 lb 15.6 oz (2710 g) APGAR: 9, 9  Newborn Delivery   Birth date/time:  03/19/2017 08:34:00 Delivery type:  C-Section, Low Transverse C-section categorization:  Primary     Home with mother.  ANDERSON,MARK E 03/21/2017, 9:14 AM

## 2017-03-23 LAB — TYPE AND SCREEN
ABO/RH(D): O NEG
Antibody Screen: POSITIVE
UNIT DIVISION: 0
UNIT DIVISION: 0

## 2017-03-23 LAB — BPAM RBC
BLOOD PRODUCT EXPIRATION DATE: 201812052359
Blood Product Expiration Date: 201812052359
Unit Type and Rh: 9500
Unit Type and Rh: 9500

## 2017-03-29 ENCOUNTER — Encounter (HOSPITAL_COMMUNITY)
Admission: RE | Admit: 2017-03-29 | Discharge: 2017-03-29 | Disposition: A | Discharge status: Home / Self Care | Payer: 59 | Source: Ambulatory Visit

## 2017-04-08 DIAGNOSIS — Z124 Encounter for screening for malignant neoplasm of cervix: Secondary | ICD-10-CM | POA: Diagnosis not present

## 2017-09-02 DIAGNOSIS — R8290 Unspecified abnormal findings in urine: Secondary | ICD-10-CM | POA: Diagnosis not present

## 2017-09-02 DIAGNOSIS — N750 Cyst of Bartholin's gland: Secondary | ICD-10-CM | POA: Diagnosis not present

## 2017-09-02 MED FILL — CEFUROXIME AXETIL 250 MG TA: 250 | 7 days supply | Qty: 14 | Fill #0

## 2017-11-19 DIAGNOSIS — N3001 Acute cystitis with hematuria: Secondary | ICD-10-CM | POA: Diagnosis not present

## 2017-11-19 DIAGNOSIS — N111 Chronic obstructive pyelonephritis: Secondary | ICD-10-CM | POA: Diagnosis not present

## 2017-12-20 DIAGNOSIS — N111 Chronic obstructive pyelonephritis: Secondary | ICD-10-CM | POA: Diagnosis not present

## 2017-12-20 DIAGNOSIS — N3001 Acute cystitis with hematuria: Secondary | ICD-10-CM | POA: Diagnosis not present

## 2017-12-20 DIAGNOSIS — N390 Urinary tract infection, site not specified: Secondary | ICD-10-CM | POA: Diagnosis not present

## 2017-12-20 MED FILL — CEPHALEXIN 500 MG CAPSULE: 500 | 7 days supply | Qty: 21 | Fill #0

## 2018-05-05 DIAGNOSIS — Z01419 Encounter for gynecological examination (general) (routine) without abnormal findings: Secondary | ICD-10-CM | POA: Diagnosis not present

## 2018-05-05 DIAGNOSIS — Z124 Encounter for screening for malignant neoplasm of cervix: Secondary | ICD-10-CM | POA: Diagnosis not present

## 2018-05-23 DIAGNOSIS — F419 Anxiety disorder, unspecified: Secondary | ICD-10-CM | POA: Diagnosis not present

## 2018-05-23 MED FILL — ALPRAZolam 0.5 MG TABS: 0.5 | 15 days supply | Qty: 30 | Fill #0

## 2018-07-22 MED FILL — ALPRAZolam 0.5 MG TABS: 0.5 | 15 days supply | Qty: 30 | Fill #1 | Status: TO

## 2018-08-02 DIAGNOSIS — Z319 Encounter for procreative management, unspecified: Secondary | ICD-10-CM | POA: Diagnosis not present

## 2018-08-10 MED FILL — ALPRAZolam 0.5 MG TABS: 0.5 | 15 days supply | Qty: 30 | Fill #0 | Status: TO

## 2018-10-10 MED FILL — ALPRAZolam 0.5 MG TABS: 0.5 | 15 days supply | Qty: 30 | Fill #0

## 2018-11-17 IMAGING — US US MFM OB TRANSVAGINAL
1 series · 14 of 28 positions shown · non-contrast
Comparison: none

[Series 1: us mfm ob transvaginal · 14 of 32 slices shown]
[im 2/32]
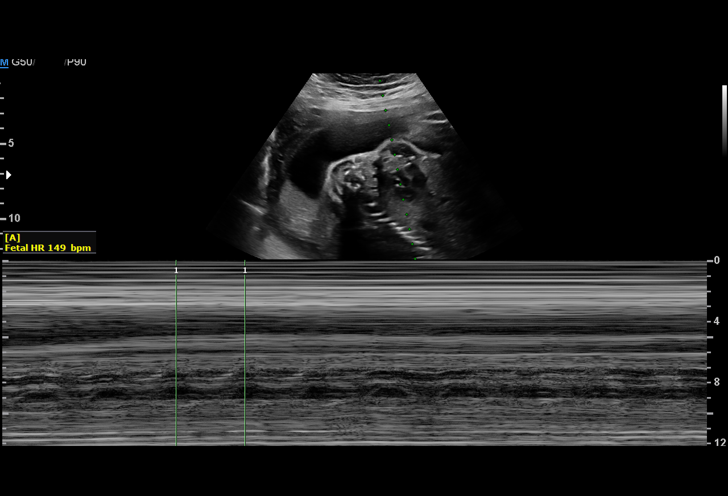
[im 4/32]
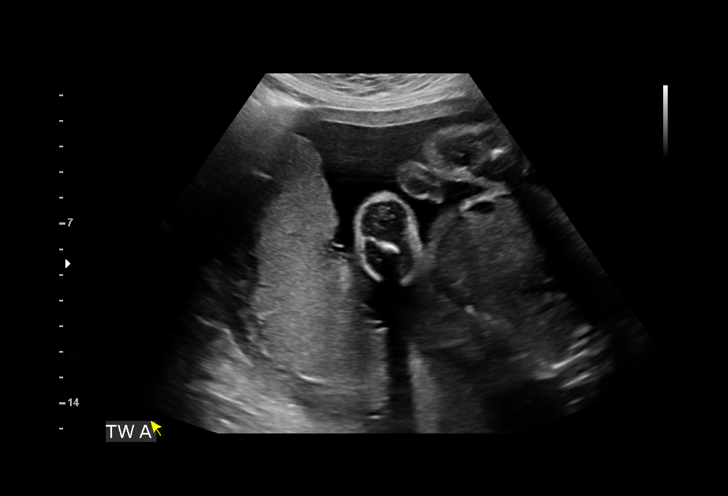
[im 6/32]
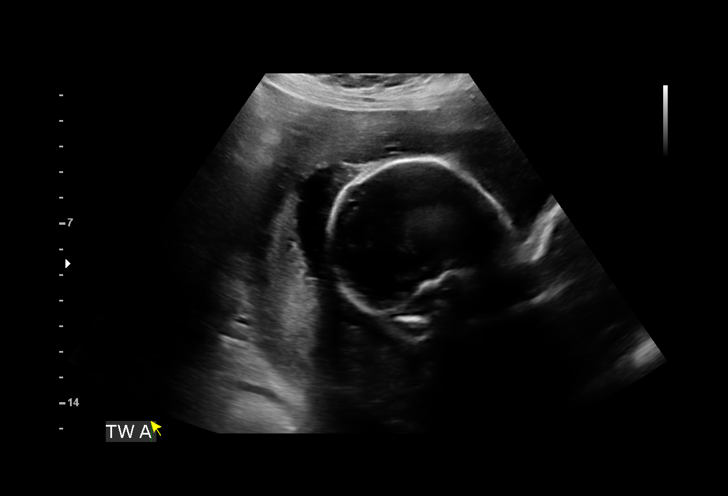
[im 9/32]
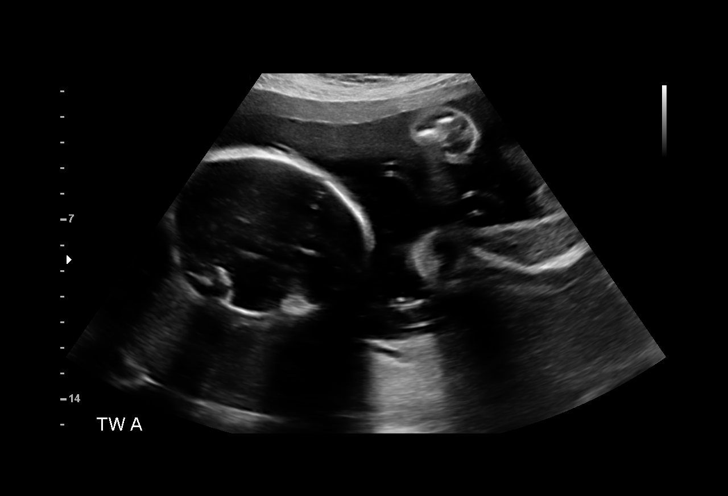
[im 11/32]
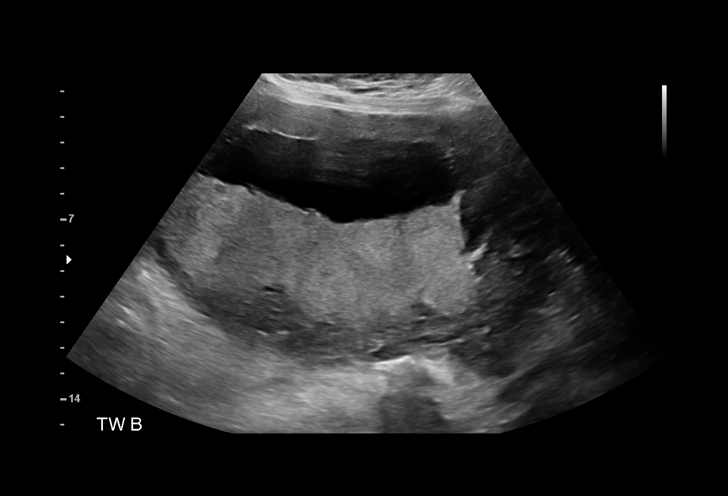
[im 13/32]
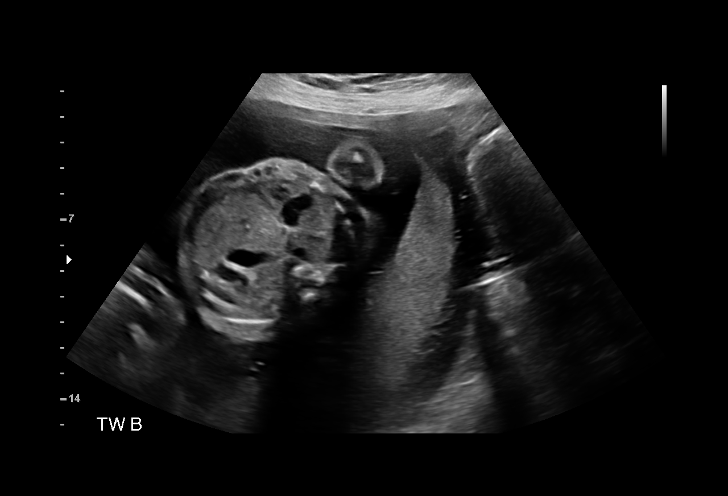
[im 15/32]
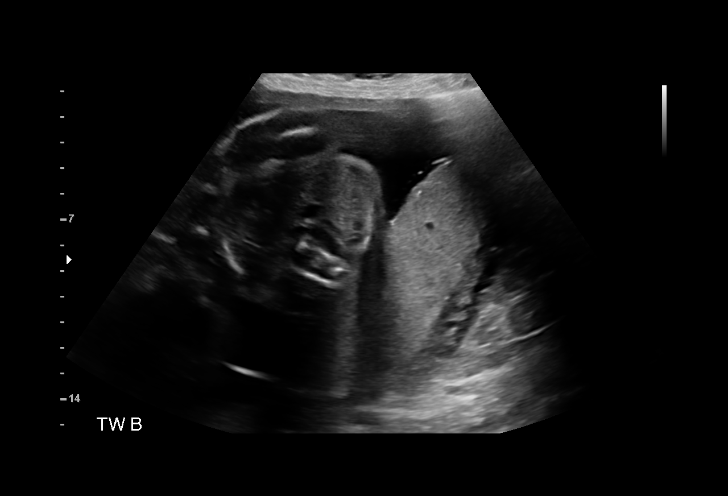
[im 18/32]
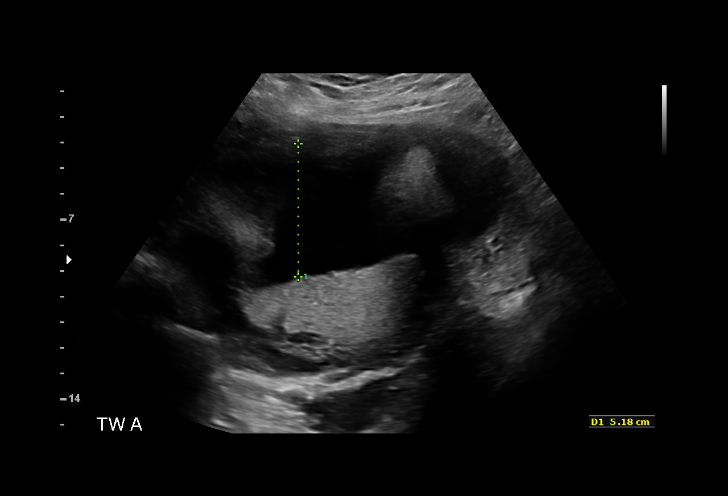
[im 20/32]
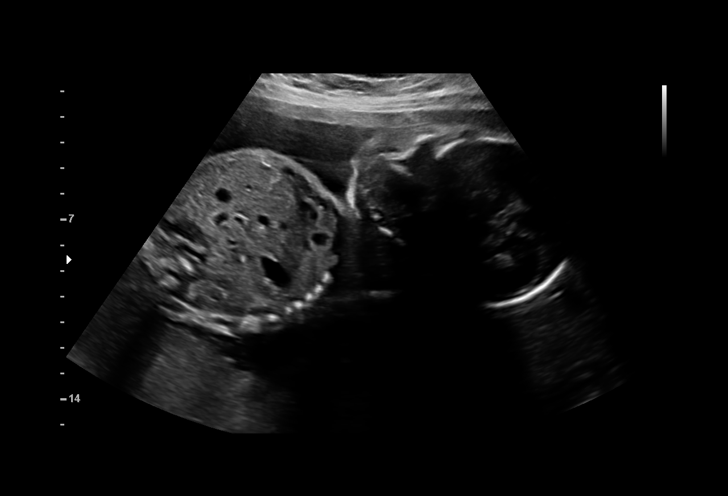
[im 22/32]
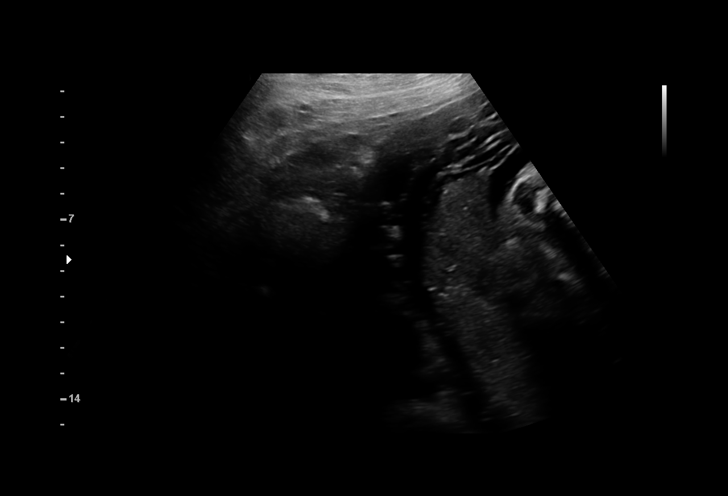
[im 25/32]
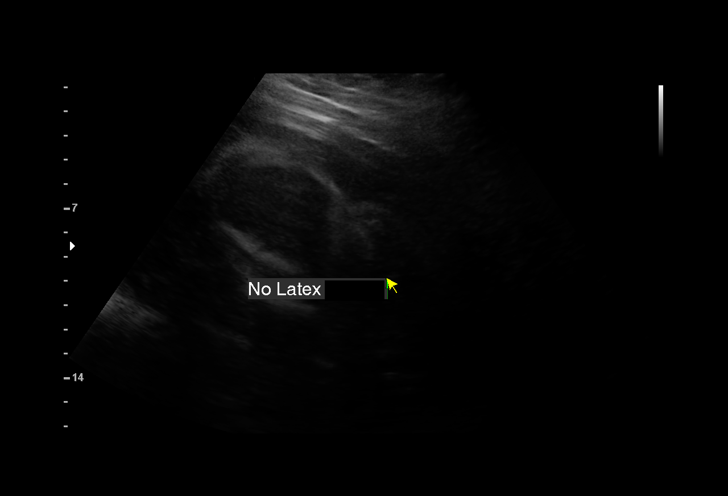
[im 27/32]
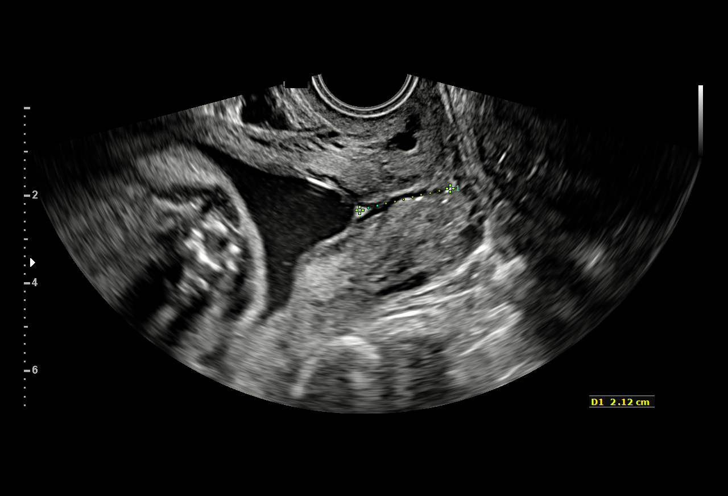
[im 29/32]
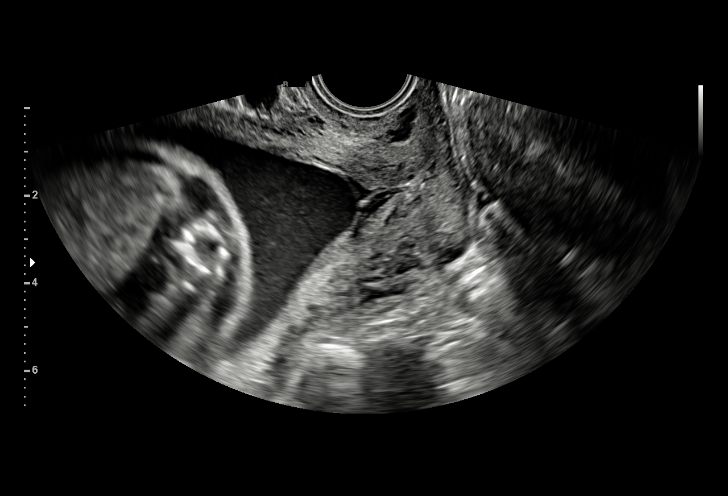
[im 32/32]
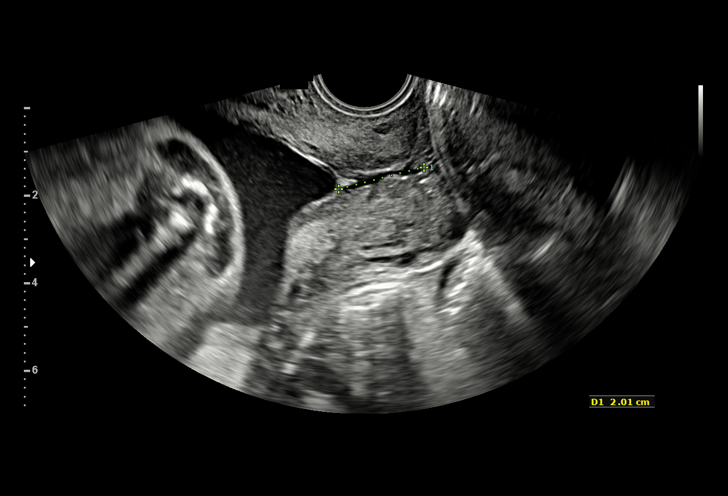

[14 of 28 positions shown; findings below may reference images not displayed]

MAU/Triage

Indications

25 weeks gestation of pregnancy
Abdominal pain in pregnancy
Vaginal bleeding in pregnancy, second
trimester
Pregnancy resulting from assisted
reproductive technology (IVF)
Advanced maternal age multigravida 35+,
second trimester(Declined
Twin pregnancy, di/di, second trimester
OB History

Blood Type:            Height:  5'4"   Weight (lb):  231       BMI:
Gravidity:    2         Term:   1        Prem:   0        SAB:   0
TOP:          0       Ectopic:  0        Living: 1
Fetal Evaluation (Fetus A)

Num Of Fetuses:     2
Fetal Heart         149
Rate(bpm):
Cardiac Activity:   Observed
Fetal Lie:          Right Fetus
Presentation:       Breech
Placenta:           Posterior, above cervical os

Amniotic Fluid
AFI FV:      Subjectively within normal limits
Largest Pocket(cm)
5.2

Comment:    No placental abruption or previa identified.   Portions of posterior
placenta are obscurred by fetal parts.
Gestational Age (Fetus A)

LMP:           25w 6d        Date:  07/03/16                 EDD:   04/09/17
Best:          25w 6d     Det. By:  LMP  (07/03/16)          EDD:   04/09/17

Fetal Evaluation (Fetus B)

Num Of Fetuses:     2
Fetal Heart         153
Rate(bpm):
Cardiac Activity:   Observed
Fetal Lie:          Left Fetus
Presentation:       Cephalic
Placenta:           Left lateral, above cervical os

Amniotic Fluid
AFI FV:      Subjectively within normal limits

Largest Pocket(cm)
7

Comment:    No placental abruption or previa identified.
Gestational Age (Fetus B)

LMP:           25w 6d        Date:  07/03/16                 EDD:   04/09/17
Best:          25w 6d     Det. By:  LMP  (07/03/16)          EDD:   04/09/17
Cervix Uterus Adnexa

Cervix
Length:            2.2  cm.
Measured transvaginally.

Left Ovary
Not visualized.

Right Ovary
Not visualized.
Impression

Diamniotic Dichorionic intrauterine pregnancy at 25+6 weeks
with vaginal bleeding and abdominal pain
Presentation is breech/cephalic
Normal amniotic fluid in each sac, with  MVP of 5.2 cm for A
and 7 cm for B
Placentation is normal for both twins without previa and no
sonographic markers for abruption
Transvaginal scan shows a short cervix 20-22 mm in length
with funnelling
Recommendations

Continue clinical evaluation and management

## 2018-11-25 MED FILL — ALPRAZolam 0.5 MG TABS: 0.5 | 15 days supply | Qty: 30 | Fill #0

## 2018-12-08 IMAGING — US US MFM OB FOLLOW-UP
1 series · 12 of 28 positions shown · non-contrast
Comparison: none

[Series 1: us mfm ob follow-up · 12 of 105 slices shown]
[im 4/105]
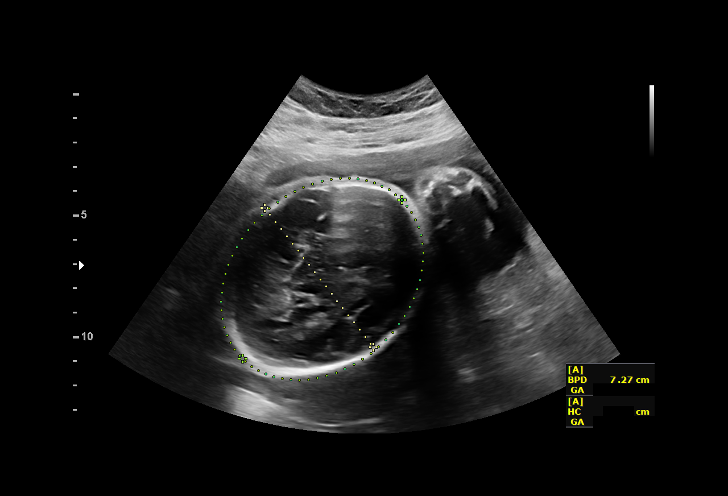
[im 12/105]
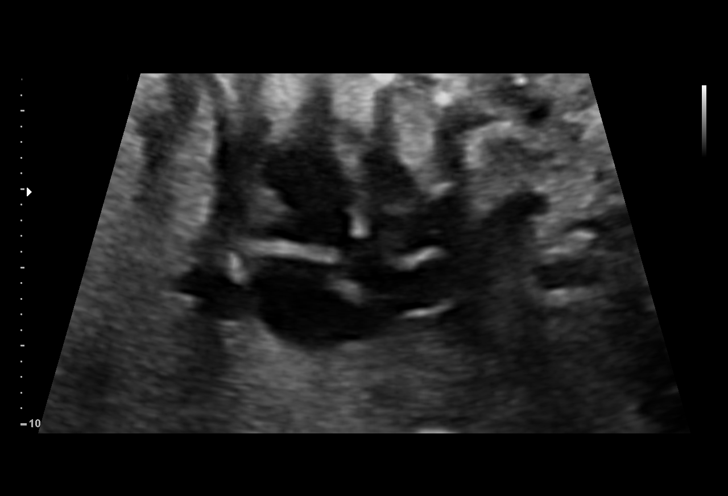
[im 20/105]
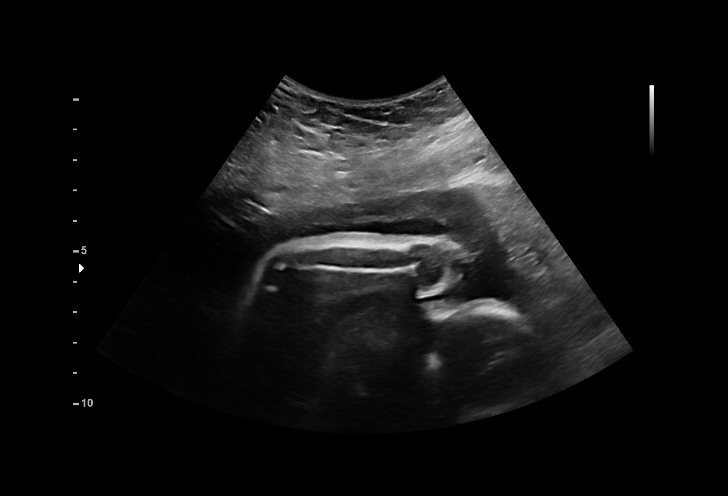
[im 31/105]
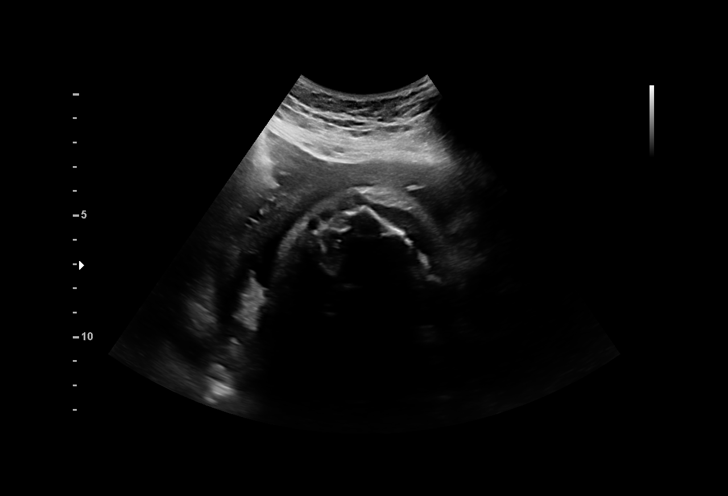
[im 39/105]
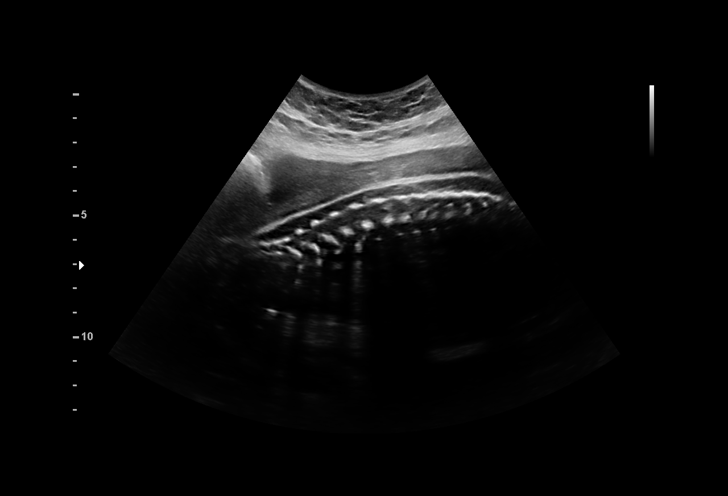
[im 47/105]
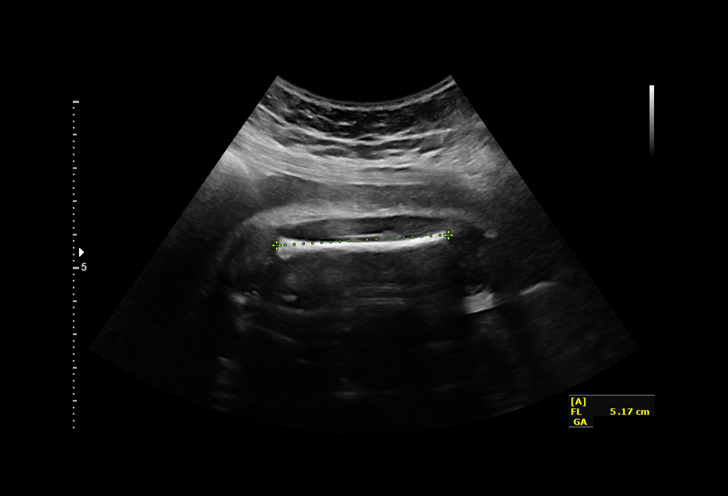
[im 58/105]
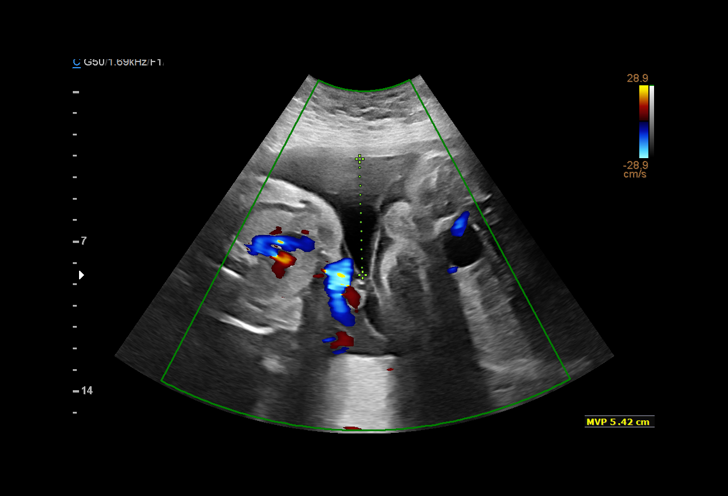
[im 66/105]
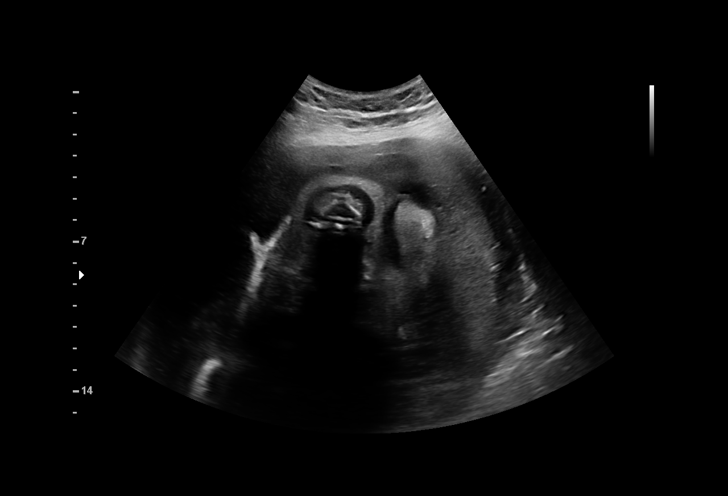
[im 74/105]
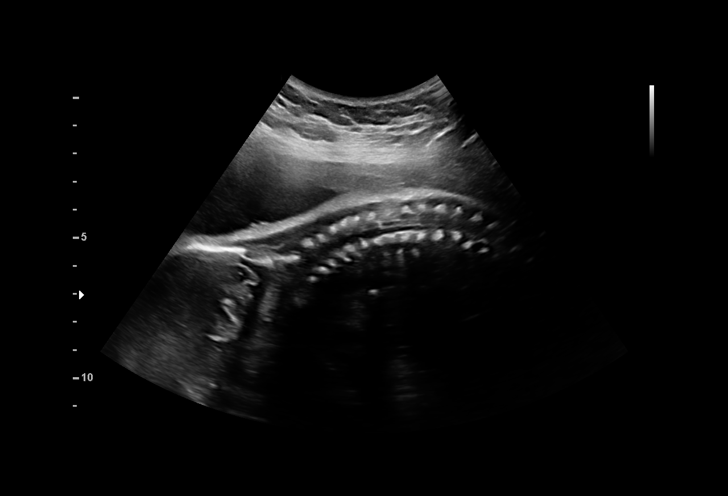
[im 85/105]
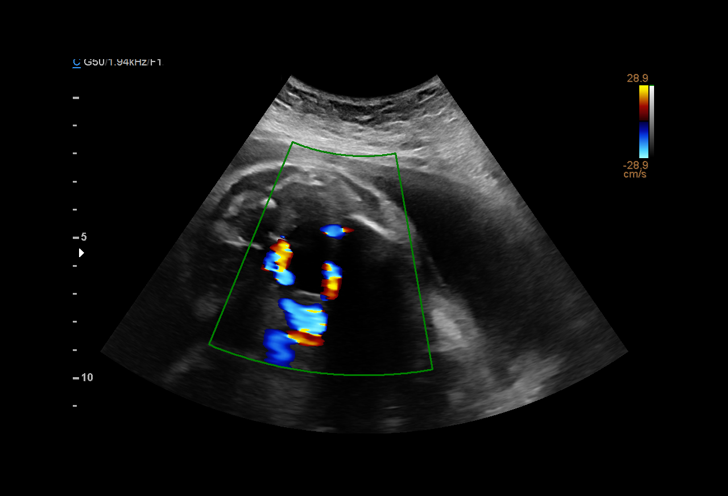
[im 93/105]
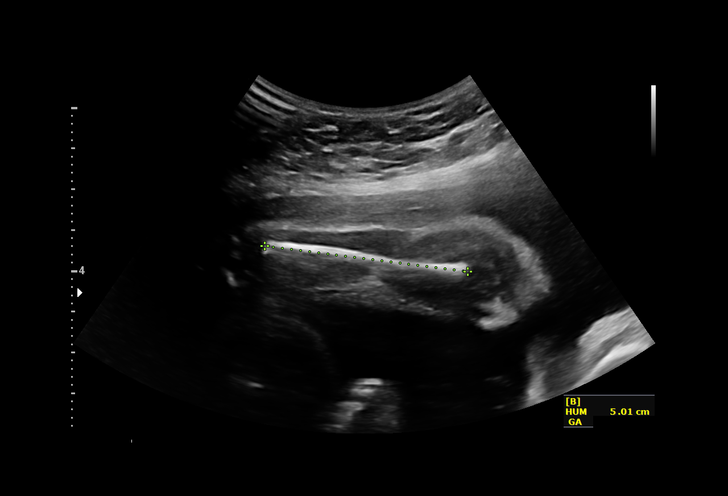
[im 101/105]
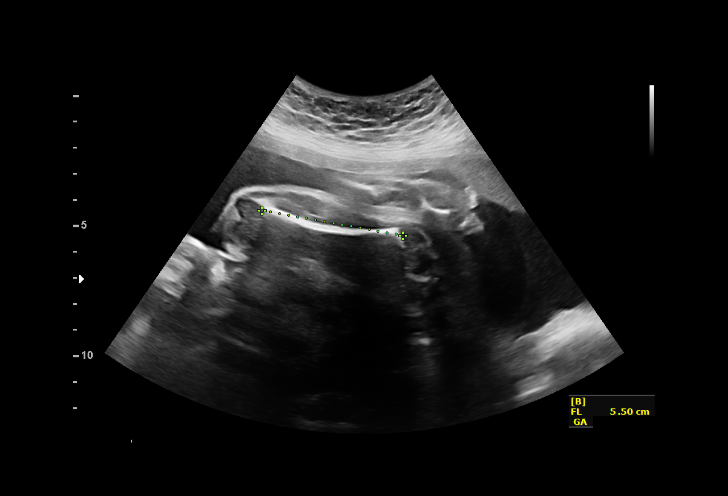

[12 of 28 positions shown; findings below may reference images not displayed]

Attending:        Josiela Lejosne         Secondary Phy.:   [facility]-
MAU/Triage

1  SWAZELIHLE AHRENDS            [id number]      [id number]     [id number]
2  SWAZELIHLE AHRENDS            [id number]      [id number]     [id number]
Indications

28 weeks gestation of pregnancy
Pregnancy resulting from assisted
reproductive technology (IVF)
Advanced maternal age multigravida 35+,
third trimester (Declined testing)
Twin pregnancy, di/di, third trimester
Encounter for other antenatal screening
follow-up
OB History

Blood Type:            Height:  5'4"   Weight (lb):  231      BMI:
Gravidity:    2         Term:   1        Prem:   0        SAB:   0
TOP:          0       Ectopic:  0        Living: 1
Fetal Evaluation (Fetus A)

Num Of Fetuses:     2
Fetal Heart         145
Rate(bpm):
Cardiac Activity:   Observed
Fetal Lie:          Maternal right side
Presentation:       Cephalic
Placenta:           Posterior, above cervical os
P. Cord Insertion:  Not well visualized
Membrane Desc:      Dividing Membrane seen
Amniotic Fluid
AFI FV:      Subjectively within normal limits

Largest Pocket(cm)
5.42
Biometry (Fetus A)

BPD:      72.3  mm     G. Age:  29w 0d         43  %    CI:        76.39   %   70 - 86
FL/HC:      20.3   %   19.6 -
HC:      262.1  mm     G. Age:  28w 4d         12  %    HC/AC:      1.02       0.99 -
AC:      257.1  mm     G. Age:  29w 6d         74  %    FL/BPD:     73.6   %   71 - 87
FL:       53.2  mm     G. Age:  28w 2d         20  %    FL/AC:      20.7   %   20 - 24
HUM:        48  mm     G. Age:  28w 1d         31  %

Est. FW:    1222  gm    2 lb 15 oz      57  %     FW Discordancy      0 \ 0 %
Gestational Age (Fetus A)

LMP:           28w 6d       Date:   07/03/16                 EDD:   04/09/17
U/S Today:     29w 0d                                        EDD:   04/08/17
Best:          28w 6d    Det. By:   LMP  (07/03/16)          EDD:   04/09/17
Anatomy (Fetus A)

Cranium:               Appears normal         Aortic Arch:            Previously seen
Cavum:                 Previously seen        Ductal Arch:            Not well visualized
Ventricles:            Appears normal         Diaphragm:              Previously seen
Choroid Plexus:        Previously seen        Stomach:                Appears normal, left
sided
Cerebellum:            Previously seen        Abdomen:                Appears normal
Posterior Fossa:       Previously seen        Abdominal Wall:         Not well visualized
Nuchal Fold:           Not applicable (>20    Cord Vessels:           Previously seen
wks GA)
Face:                  Profile appears        Kidneys:                Appear normal
normal; orbit prev
vis
Lips:                  Previously seen        Bladder:                Appears normal
Thoracic:              Appears normal         Spine:                  Appears normal
Heart:                 Appears normal         Upper Extremities:      Lt appears nl; rt
(4CH, axis, and situs                          nwv; hands nwv
RVOT:                  Not well visualized    Lower Extremities:      Appears normal;
ankles nwv
LVOT:                  Appears normal

Other:  Female gender.

Fetal Evaluation (Fetus B)

Num Of Fetuses:     2
Fetal Heart         141
Rate(bpm):
Cardiac Activity:   Observed
Fetal Lie:          Maternal left side
Presentation:       Breech
Placenta:           Left lateral, above cervical os
P. Cord Insertion:  Not well visualized
Membrane Desc:      Dividing Membrane seen
Amniotic Fluid
AFI FV:      Subjectively within normal limits

Largest Pocket(cm)
4.94
Biometry (Fetus B)

BPD:        70  mm     G. Age:  28w 1d         17  %    CI:        73.51   %   70 - 86
FL/HC:      21.2   %   19.6 -
HC:      259.4  mm     G. Age:  28w 2d          8  %    HC/AC:      1.02       0.99 -
AC:      253.9  mm     G. Age:  29w 4d         65  %    FL/BPD:     78.6   %   71 - 87
FL:         55  mm     G. Age:  29w 0d         40  %    FL/AC:      21.7   %   20 - 24
HUM:      50.6  mm     G. Age:  29w 5d         60  %

Est. FW:    1222  gm    2 lb 15 oz      57  %     FW Discordancy      0 \ 0 %
Gestational Age (Fetus B)

LMP:           28w 6d       Date:   07/03/16                 EDD:   04/09/17
U/S Today:     28w 5d                                        EDD:   04/10/17
Best:          28w 6d    Det. By:   LMP  (07/03/16)          EDD:   04/09/17
Anatomy (Fetus B)

Cranium:               Appears normal         Aortic Arch:            Previously seen
Cavum:                 Previously seen        Ductal Arch:            Previously seen
Ventricles:            Appears normal         Diaphragm:              Previously seen
Choroid Plexus:        Previously seen        Stomach:                Appears normal, left
sided
Cerebellum:            Previously seen        Abdomen:                Previously seen
Posterior Fossa:       Previously seen        Abdominal Wall:         Previously seen
Nuchal Fold:           Not applicable (>20    Cord Vessels:           Appears normal (3
wks GA)                                        vessel cord)
Face:                  Orbits and profile     Kidneys:                Appear normal
previously seen
Lips:                  Previously seen        Bladder:                Appears normal
Thoracic:              Appears normal         Spine:                  Appears normal
Heart:                 Not well visualized    Upper Extremities:      Previously seen
RVOT:                  Previously seen        Lower Extremities:      Previously seen
LVOT:                  Previously seen

Other:  Male gender. Heels prev visualized. Complete fetal anatomic survey
previously performed.
Cervix Uterus Adnexa

Cervix
Not adaquately visualized
Impression

Dichorionic-diamniotic twin gestation at 28w 6d.

Twin A:
Maternal right, Cephalic.
Placenta posterior, above cervical os.
Appropriate fetal growth.
Normal amniotic fluid volume.
Not seen: RVOT, ductal arch, abdominal wall, hands, ankles.
No fetal anomalies or soft markers of aneuploidy seen.

Twin B:
Maternal left, Breech.
Placenta left lateral, above cervical os.
Appropriate fetal growth.
Normal amniotic fluid volume.
Not seen: 4-chamber cardiac view.
No fetal anomalies or soft markers of aneuploidy seen.
Recommendations

Continue serial ultrasounds for fetal growth and reevaluation
of fetal anatomy.
Given incomplete cardiac anatomy and IVF pregnancy,
consider fetal echocardiogram.

## 2019-01-12 ENCOUNTER — Other Ambulatory Visit: Payer: Self-pay

## 2019-01-12 DIAGNOSIS — Z20822 Contact with and (suspected) exposure to covid-19: Secondary | ICD-10-CM

## 2019-01-12 DIAGNOSIS — R6889 Other general symptoms and signs: Secondary | ICD-10-CM | POA: Diagnosis not present

## 2019-01-13 LAB — NOVEL CORONAVIRUS, NAA: SARS-CoV-2, NAA: NOT DETECTED

## 2019-01-20 MED FILL — ALPRAZolam 0.5 MG TABS: 0.5 | 15 days supply | Qty: 30 | Fill #0

## 2019-04-14 MED FILL — ALPRAZolam 0.5 MG TABS: 0.5 | 15 days supply | Qty: 30 | Fill #1

## 2019-05-25 DIAGNOSIS — Z20828 Contact with and (suspected) exposure to other viral communicable diseases: Secondary | ICD-10-CM | POA: Diagnosis not present

## 2019-05-25 DIAGNOSIS — J018 Other acute sinusitis: Secondary | ICD-10-CM | POA: Diagnosis not present

## 2019-06-08 MED FILL — ALPRAZolam 0.5 MG TABS: 0.5 | 15 days supply | Qty: 30 | Fill #2

## 2019-07-17 MED FILL — ALPRAZolam 0.5 MG TABS: 0.5 | 15 days supply | Qty: 30 | Fill #3

## 2019-07-28 DIAGNOSIS — Z319 Encounter for procreative management, unspecified: Secondary | ICD-10-CM | POA: Diagnosis not present

## 2019-09-29 ENCOUNTER — Other Ambulatory Visit (HOSPITAL_COMMUNITY): Payer: Self-pay | Admitting: Internal Medicine

## 2019-09-29 DIAGNOSIS — F419 Anxiety disorder, unspecified: Secondary | ICD-10-CM | POA: Diagnosis not present

## 2019-09-29 DIAGNOSIS — Z6841 Body Mass Index (BMI) 40.0 and over, adult: Secondary | ICD-10-CM | POA: Diagnosis not present

## 2019-09-29 MED FILL — FLUoxetine HCL 20 MG TABS: 20 | 30 days supply | Qty: 30 | Fill #0

## 2019-09-29 MED FILL — ALPRAZolam 0.5 MG TABS: 0.5 | 15 days supply | Qty: 30 | Fill #0

## 2019-10-27 DIAGNOSIS — R87610 Atypical squamous cells of undetermined significance on cytologic smear of cervix (ASC-US): Secondary | ICD-10-CM | POA: Diagnosis not present

## 2019-10-27 DIAGNOSIS — Z01419 Encounter for gynecological examination (general) (routine) without abnormal findings: Secondary | ICD-10-CM | POA: Diagnosis not present

## 2019-10-27 DIAGNOSIS — R87810 Cervical high risk human papillomavirus (HPV) DNA test positive: Secondary | ICD-10-CM | POA: Diagnosis not present

## 2019-11-06 MED FILL — ALPRAZolam 0.5 MG TABS: 0.5 | 15 days supply | Qty: 30 | Fill #1

## 2019-11-06 MED FILL — FLUoxetine HCL 20 MG TABS: 20 | 30 days supply | Qty: 30 | Fill #1

## 2019-11-23 DIAGNOSIS — N921 Excessive and frequent menstruation with irregular cycle: Secondary | ICD-10-CM | POA: Diagnosis not present

## 2019-12-14 MED FILL — FLUoxetine HCL 20 MG TABS: 20 | 30 days supply | Qty: 30 | Fill #2

## 2019-12-14 MED FILL — ALPRAZolam 0.5 MG TABS: 0.5 | 15 days supply | Qty: 30 | Fill #2

## 2019-12-15 DIAGNOSIS — N939 Abnormal uterine and vaginal bleeding, unspecified: Secondary | ICD-10-CM | POA: Diagnosis not present

## 2019-12-15 DIAGNOSIS — N840 Polyp of corpus uteri: Secondary | ICD-10-CM | POA: Diagnosis not present

## 2019-12-15 DIAGNOSIS — Z3202 Encounter for pregnancy test, result negative: Secondary | ICD-10-CM | POA: Diagnosis not present

## 2019-12-15 MED FILL — traMADol HCL 50 MG TABS: 50 | 7 days supply | Qty: 30 | Fill #0

## 2020-01-03 DIAGNOSIS — N760 Acute vaginitis: Secondary | ICD-10-CM | POA: Diagnosis not present

## 2020-02-12 MED FILL — ALPRAZolam 0.5 MG TABS: 0.5 | 15 days supply | Qty: 30 | Fill #3

## 2020-02-12 MED FILL — FLUoxetine HCL 20 MG TABS: 20 | 30 days supply | Qty: 30 | Fill #3

## 2020-04-23 DIAGNOSIS — R87610 Atypical squamous cells of undetermined significance on cytologic smear of cervix (ASC-US): Secondary | ICD-10-CM | POA: Diagnosis not present

## 2020-04-23 MED FILL — ALPRAZolam 0.5 MG TABS: 0.5 | 15 days supply | Qty: 30 | Fill #0

## 2020-08-22 ENCOUNTER — Other Ambulatory Visit (HOSPITAL_COMMUNITY): Payer: Self-pay | Admitting: Internal Medicine

## 2020-08-23 ENCOUNTER — Other Ambulatory Visit (HOSPITAL_COMMUNITY): Payer: Self-pay

## 2020-08-26 ENCOUNTER — Other Ambulatory Visit (HOSPITAL_COMMUNITY): Payer: Self-pay | Admitting: Internal Medicine

## 2020-08-26 ENCOUNTER — Other Ambulatory Visit (HOSPITAL_COMMUNITY): Payer: Self-pay

## 2020-08-27 ENCOUNTER — Other Ambulatory Visit (HOSPITAL_COMMUNITY): Payer: Self-pay

## 2020-08-30 ENCOUNTER — Other Ambulatory Visit (HOSPITAL_COMMUNITY): Payer: Self-pay | Admitting: Internal Medicine

## 2020-08-30 ENCOUNTER — Other Ambulatory Visit (HOSPITAL_COMMUNITY): Payer: Self-pay

## 2020-09-04 ENCOUNTER — Other Ambulatory Visit (HOSPITAL_COMMUNITY): Payer: Self-pay | Admitting: Internal Medicine

## 2020-09-04 ENCOUNTER — Other Ambulatory Visit (HOSPITAL_COMMUNITY): Payer: Self-pay

## 2020-10-23 ENCOUNTER — Other Ambulatory Visit (HOSPITAL_COMMUNITY): Payer: Self-pay

## 2020-10-23 MED ORDER — FLUOXETINE HCL 20 MG PO TABS
20.0000 mg | ORAL_TABLET | Freq: Every day | ORAL | 4 refills | Status: DC
  Filled 2020-10-23: qty 90, 90d supply, fill #0
  Filled 2021-07-31: qty 90, 90d supply, fill #1

## 2020-10-30 ENCOUNTER — Other Ambulatory Visit (HOSPITAL_COMMUNITY): Payer: Self-pay

## 2020-10-30 DIAGNOSIS — R87810 Cervical high risk human papillomavirus (HPV) DNA test positive: Secondary | ICD-10-CM | POA: Diagnosis not present

## 2020-10-30 DIAGNOSIS — Z01419 Encounter for gynecological examination (general) (routine) without abnormal findings: Secondary | ICD-10-CM | POA: Diagnosis not present

## 2020-10-30 DIAGNOSIS — Z124 Encounter for screening for malignant neoplasm of cervix: Secondary | ICD-10-CM | POA: Diagnosis not present

## 2020-10-30 MED ORDER — IBUPROFEN 800 MG PO TABS
800.0000 mg | ORAL_TABLET | Freq: Three times a day (TID) | ORAL | 4 refills | Status: DC
  Filled 2020-10-30: qty 60, 20d supply, fill #0

## 2021-01-30 DIAGNOSIS — M65872 Other synovitis and tenosynovitis, left ankle and foot: Secondary | ICD-10-CM | POA: Diagnosis not present

## 2021-01-30 DIAGNOSIS — M79672 Pain in left foot: Secondary | ICD-10-CM | POA: Diagnosis not present

## 2021-01-30 DIAGNOSIS — M12272 Villonodular synovitis (pigmented), left ankle and foot: Secondary | ICD-10-CM | POA: Diagnosis not present

## 2021-01-30 DIAGNOSIS — M25572 Pain in left ankle and joints of left foot: Secondary | ICD-10-CM | POA: Diagnosis not present

## 2021-02-19 ENCOUNTER — Other Ambulatory Visit (HOSPITAL_BASED_OUTPATIENT_CLINIC_OR_DEPARTMENT_OTHER): Payer: Self-pay

## 2021-02-19 DIAGNOSIS — M25572 Pain in left ankle and joints of left foot: Secondary | ICD-10-CM | POA: Diagnosis not present

## 2021-02-19 DIAGNOSIS — M65872 Other synovitis and tenosynovitis, left ankle and foot: Secondary | ICD-10-CM | POA: Diagnosis not present

## 2021-02-19 MED ORDER — PREDNISONE 5 MG PO TABS
ORAL_TABLET | ORAL | 0 refills | Status: DC
  Filled 2021-02-19: qty 42, 12d supply, fill #0

## 2021-03-05 ENCOUNTER — Other Ambulatory Visit (HOSPITAL_BASED_OUTPATIENT_CLINIC_OR_DEPARTMENT_OTHER): Payer: Self-pay

## 2021-03-24 DIAGNOSIS — M791 Myalgia, unspecified site: Secondary | ICD-10-CM | POA: Diagnosis not present

## 2021-03-24 DIAGNOSIS — Z20822 Contact with and (suspected) exposure to covid-19: Secondary | ICD-10-CM | POA: Diagnosis not present

## 2021-03-24 DIAGNOSIS — H6692 Otitis media, unspecified, left ear: Secondary | ICD-10-CM | POA: Diagnosis not present

## 2021-04-02 ENCOUNTER — Other Ambulatory Visit (HOSPITAL_COMMUNITY): Payer: Self-pay | Admitting: Obstetrics and Gynecology

## 2021-04-02 DIAGNOSIS — Z0000 Encounter for general adult medical examination without abnormal findings: Secondary | ICD-10-CM

## 2021-04-16 ENCOUNTER — Other Ambulatory Visit: Payer: Self-pay

## 2021-04-16 ENCOUNTER — Ambulatory Visit (HOSPITAL_COMMUNITY)
Admission: RE | Admit: 2021-04-16 | Discharge: 2021-04-16 | Disposition: A | Discharge status: Home / Self Care | Payer: 59 | Source: Ambulatory Visit | Attending: Obstetrics and Gynecology | Admitting: Obstetrics and Gynecology

## 2021-04-16 DIAGNOSIS — Z1231 Encounter for screening mammogram for malignant neoplasm of breast: Secondary | ICD-10-CM | POA: Insufficient documentation

## 2021-04-16 DIAGNOSIS — Z0000 Encounter for general adult medical examination without abnormal findings: Secondary | ICD-10-CM | POA: Insufficient documentation

## 2021-04-25 ENCOUNTER — Other Ambulatory Visit (HOSPITAL_COMMUNITY): Payer: Self-pay

## 2021-04-25 DIAGNOSIS — R87810 Cervical high risk human papillomavirus (HPV) DNA test positive: Secondary | ICD-10-CM | POA: Diagnosis not present

## 2021-04-25 DIAGNOSIS — N946 Dysmenorrhea, unspecified: Secondary | ICD-10-CM | POA: Diagnosis not present

## 2021-04-25 MED ORDER — IBUPROFEN 800 MG PO TABS
800.0000 mg | ORAL_TABLET | Freq: Three times a day (TID) | ORAL | 4 refills | Status: DC
  Filled 2021-04-25: qty 90, 30d supply, fill #0

## 2021-04-25 MED ORDER — NORETHINDRONE 0.35 MG PO TABS
1.0000 | ORAL_TABLET | Freq: Every day | ORAL | 4 refills | Status: DC
  Filled 2021-04-25: qty 84, 84d supply, fill #0

## 2021-05-22 ENCOUNTER — Other Ambulatory Visit: Payer: Self-pay

## 2021-05-22 DIAGNOSIS — R6882 Decreased libido: Secondary | ICD-10-CM | POA: Diagnosis not present

## 2021-05-22 DIAGNOSIS — R5383 Other fatigue: Secondary | ICD-10-CM | POA: Diagnosis not present

## 2021-05-22 DIAGNOSIS — R61 Generalized hyperhidrosis: Secondary | ICD-10-CM | POA: Diagnosis not present

## 2021-05-22 DIAGNOSIS — R110 Nausea: Secondary | ICD-10-CM | POA: Diagnosis not present

## 2021-05-22 DIAGNOSIS — E663 Overweight: Secondary | ICD-10-CM | POA: Diagnosis not present

## 2021-05-22 DIAGNOSIS — R41840 Attention and concentration deficit: Secondary | ICD-10-CM | POA: Diagnosis not present

## 2021-05-22 DIAGNOSIS — E282 Polycystic ovarian syndrome: Secondary | ICD-10-CM | POA: Diagnosis not present

## 2021-05-22 MED ORDER — ONDANSETRON 4 MG PO TBDP
ORAL_TABLET | ORAL | 2 refills | Status: DC
  Filled 2021-05-22: qty 30, 10d supply, fill #0

## 2021-05-23 DIAGNOSIS — E663 Overweight: Secondary | ICD-10-CM | POA: Diagnosis not present

## 2021-05-23 DIAGNOSIS — N926 Irregular menstruation, unspecified: Secondary | ICD-10-CM | POA: Diagnosis not present

## 2021-05-23 DIAGNOSIS — R5382 Chronic fatigue, unspecified: Secondary | ICD-10-CM | POA: Diagnosis not present

## 2021-05-23 DIAGNOSIS — E6609 Other obesity due to excess calories: Secondary | ICD-10-CM | POA: Diagnosis not present

## 2021-05-23 DIAGNOSIS — L658 Other specified nonscarring hair loss: Secondary | ICD-10-CM | POA: Diagnosis not present

## 2021-05-29 ENCOUNTER — Other Ambulatory Visit: Payer: Self-pay

## 2021-05-29 DIAGNOSIS — E559 Vitamin D deficiency, unspecified: Secondary | ICD-10-CM | POA: Diagnosis not present

## 2021-05-29 DIAGNOSIS — R7989 Other specified abnormal findings of blood chemistry: Secondary | ICD-10-CM | POA: Diagnosis not present

## 2021-05-29 DIAGNOSIS — R7303 Prediabetes: Secondary | ICD-10-CM | POA: Diagnosis not present

## 2021-05-29 DIAGNOSIS — R891 Abnormal level of hormones in specimens from other organs, systems and tissues: Secondary | ICD-10-CM | POA: Diagnosis not present

## 2021-05-29 MED ORDER — OZEMPIC (0.25 OR 0.5 MG/DOSE) 2 MG/1.5ML ~~LOC~~ SOPN
0.2500 mg | PEN_INJECTOR | SUBCUTANEOUS | 3 refills | Status: DC
  Filled 2021-05-29: qty 1.5, 30d supply, fill #0
  Filled 2021-05-30 – 2021-06-05 (×2): qty 1.5, 56d supply, fill #0
  Filled 2021-06-18: qty 1.5, 30d supply, fill #0

## 2021-05-29 MED ORDER — TESTOSTERONE 20.25 MG/ACT (1.62%) TD GEL
TRANSDERMAL | 3 refills | Status: DC
  Filled 2021-05-29 – 2021-06-05 (×2): qty 75, 30d supply, fill #0

## 2021-05-29 MED ORDER — VITAMIN D (ERGOCALCIFEROL) 1.25 MG (50000 UNIT) PO CAPS
50000.0000 [IU] | ORAL_CAPSULE | ORAL | 1 refills | Status: DC
  Filled 2021-05-29 – 2021-05-30 (×2): qty 12, 84d supply, fill #0
  Filled 2021-08-17 – 2021-09-01 (×2): qty 12, 84d supply, fill #1

## 2021-05-30 ENCOUNTER — Other Ambulatory Visit: Payer: Self-pay

## 2021-05-30 ENCOUNTER — Other Ambulatory Visit (HOSPITAL_COMMUNITY): Payer: Self-pay

## 2021-06-02 ENCOUNTER — Other Ambulatory Visit (HOSPITAL_COMMUNITY): Payer: Self-pay

## 2021-06-02 MED ORDER — TESTOSTERONE 20.25 MG/ACT (1.62%) TD GEL
1.0000 | Freq: Every morning | TRANSDERMAL | 3 refills | Status: DC
  Filled 2021-06-02: qty 75, 60d supply, fill #0

## 2021-06-05 ENCOUNTER — Other Ambulatory Visit: Payer: Self-pay

## 2021-06-18 ENCOUNTER — Other Ambulatory Visit: Payer: Self-pay

## 2021-06-26 ENCOUNTER — Other Ambulatory Visit (HOSPITAL_COMMUNITY): Payer: Self-pay

## 2021-06-26 MED ORDER — OZEMPIC (1 MG/DOSE) 4 MG/3ML ~~LOC~~ SOPN
1.0000 mg | PEN_INJECTOR | SUBCUTANEOUS | 2 refills | Status: DC
  Filled 2021-06-26: qty 3, 28d supply, fill #0

## 2021-07-31 ENCOUNTER — Other Ambulatory Visit (HOSPITAL_COMMUNITY): Payer: Self-pay

## 2021-08-18 ENCOUNTER — Other Ambulatory Visit: Payer: Self-pay

## 2021-08-25 ENCOUNTER — Other Ambulatory Visit: Payer: Self-pay

## 2021-09-01 ENCOUNTER — Other Ambulatory Visit (HOSPITAL_COMMUNITY): Payer: Self-pay

## 2021-09-29 ENCOUNTER — Ambulatory Visit
Admission: EM | Admit: 2021-09-29 | Discharge: 2021-09-29 | Disposition: A | Discharge status: Home / Self Care | Payer: 59 | Attending: Family Medicine | Admitting: Family Medicine

## 2021-09-29 DIAGNOSIS — J0300 Acute streptococcal tonsillitis, unspecified: Secondary | ICD-10-CM

## 2021-09-29 LAB — POCT RAPID STREP A (OFFICE): Rapid Strep A Screen: POSITIVE — AB

## 2021-09-29 MED ORDER — LIDOCAINE VISCOUS HCL 2 % MT SOLN: 10.0000 mL | OROMUCOSAL | 0 refills | Status: DC | PRN

## 2021-09-29 MED ORDER — AMOXICILLIN 875 MG PO TABS: 875.0000 mg | ORAL_TABLET | Freq: Two times a day (BID) | ORAL | 0 refills | Status: DC

## 2021-09-29 NOTE — ED Triage Notes (Signed)
Pt presents with c/o fever and sore throat that began yesterday

## 2021-09-29 NOTE — ED Provider Notes (Signed)
[facility]    CSN: [id number] Arrival date & time: 09/29/21  0855      History   Chief Complaint Chief Complaint  Patient presents with   Sore Throat    HPI Jennifer GaskinsJennifer D Mccormick is a 41 y.o. female.   Presenting today with 1 day history of fever, sore throat, fatigue.  Denies congestion, cough, chest pain, shortness of breath, abdominal pain, nausea vomiting or diarrhea.  Taking ibuprofen with minimal relief.  Multiple sick contacts recently.     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   Newborn product of in vitro fertilization (IVF) pregnancy     Patient Active Problem List   Diagnosis Date Noted   Postoperative state 03/19/2017   Active preterm labor 12/31/2016   Twin pregnancy in first trimester 10/08/2016   Rh negative status during pregnancy 10/08/2016   Vaginal bleeding in pregnancy, first trimester 10/08/2016    Past Surgical History:  Procedure Laterality Date   CESAREAN SECTION MULTI-GESTATIONAL N/A 03/19/2017   Procedure: CESAREAN SECTION MULTI-GESTATIONAL;  Surgeon: Carrington ClampHorvath, Michelle, MD;  [facility];  Service: Obstetrics;  Laterality: N/A;   CHOLECYSTECTOMY     COLPOSCOPY     invitrofertilization     LAPAROSCOPIC GASTRIC BAND REMOVAL WITH LAPAROSCOPIC GASTRIC SLEEVE RESECTION  2017   LEEP     WISDOM TOOTH EXTRACTION      OB History     Gravida  2   Para  2   Term  2   Preterm  0   AB  0   Living  3      SAB  0   IAB  0   Ectopic  0   Multiple  1   Live Births  3            Home Medications    Prior to Admission medications   Medication Sig Start Date End Date Taking? Authorizing Provider  amoxicillin (AMOXIL) 875 MG tablet Take 1 tablet (875 mg total) by mouth 2 (two) times daily. 09/29/21  Yes Particia NearingLane, Wilmer Santillo Elizabeth, PA-C  lidocaine (XYLOCAINE) 2 % solution Use as directed 10  mLs in the mouth or throat every 3 (three) hours as needed for mouth pain. 09/29/21  Yes Particia NearingLane, Coen Miyasato Elizabeth, PA-C  calcium carbonate (TUMS - DOSED IN MG ELEMENTAL CALCIUM) 500 MG chewable tablet Chew 4 tablets by mouth daily as needed for indigestion or heartburn.    [provider]  FLUoxetine (PROZAC) 20 MG tablet TAKE 1 TABLET BY MOUTH ONCE DAILY. 09/29/19 09/28/20  Carylon PerchesFagan, Roy, MD  FLUoxetine (PROZAC) 20 MG tablet Take 1 tablet (20 mg total) by mouth daily. 10/23/20     ibuprofen (ADVIL) 800 MG tablet Take 1 tablet (800 mg total) by mouth 3 (three) times daily. 10/30/20     ibuprofen (ADVIL) 800 MG tablet Take 1 tablet (800 mg total) by mouth 3 (three) times daily. 04/25/21     norethindrone (ORTHO MICRONOR) 0.35 MG tablet Take 1 tablet (0.35 mg total) by mouth daily. 04/25/21     ondansetron (ZOFRAN-ODT) 4 MG disintegrating tablet Place 1 tablet every 8 hours by translingual route as needed. 05/22/21     oxyCODONE (OXY IR/ROXICODONE) 5 MG immediate release tablet Take 1 tablet (5 mg total) every 4 (four) hours as needed by mouth (pain scale  4-7). 03/21/17   Levi Aland, MD  predniSONE (DELTASONE) 5 MG tablet 12 day taper (see additional paper for directions) 02/19/21     Prenatal Vit-Fe Fumarate-FA (PRENATAL MULTIVITAMIN) TABS Take 1 tablet by mouth at bedtime.     [provider]  Semaglutide, 1 MG/DOSE, (OZEMPIC, 1 MG/DOSE,) 4 MG/3ML SOPN Inject 1 mg into the skin once a week as directed 06/26/21     Semaglutide,0.25 or 0.5MG /DOS, (OZEMPIC, 0.25 OR 0.5 MG/DOSE,) 2 MG/1.5ML SOPN Inject 0.25 mg into the skin once a week as directed. 05/29/21     Testosterone (ANDROGEL PUMP) 20.25 MG/ACT (1.62%) GEL Apply 1 pump every day by transdermal route in the morning. 05/29/21     Testosterone (ANDROGEL PUMP) 20.25 MG/ACT (1.62%) GEL Place 1 Pump onto the skin every morning. 06/02/21     Vitamin D, Ergocalciferol, (DRISDOL) 1.25 MG (50000 UNIT) CAPS capsule Take 1 capsule (50,000 Units  total) by mouth once a week as directed. 05/29/21       Family History Family History  Problem Relation Age of Onset   Hypertension Mother    Alcohol abuse Father    Diabetes Brother    Hypertension Maternal Aunt    Heart disease Maternal Grandmother    Hypertension Maternal Grandmother    Diabetes Maternal Grandmother    Kidney disease Maternal Grandmother     Social History Social History   Tobacco Use   Smoking status: Never   Smokeless tobacco: Never  Substance Use Topics   Alcohol use: No   Drug use: No     Allergies   Patient has no known allergies.   Review of Systems Review of Systems Per HPI  Physical Exam Triage Vital Signs ED Triage Vitals  Enc Vitals Group     BP 09/29/21 0900 135/87     Pulse Rate 09/29/21 0900 87     Resp 09/29/21 0900 20     Temp 09/29/21 0900 98.3 F (36.8 C)     Temp src --      SpO2 09/29/21 0900 97 %     Weight --      Height --      Head Circumference --      Peak Flow --      Pain Score 09/29/21 0901 3     Pain Loc --      Pain Edu? --      Excl. in GC? --    No data found.  Updated Vital Signs BP 135/87   Pulse 87   Temp 98.3 F (36.8 C)   Resp 20   SpO2 97%   Visual Acuity Right Eye Distance:   Left Eye Distance:   Bilateral Distance:    Right Eye Near:   Left Eye Near:    Bilateral Near:     Physical Exam Vitals and nursing note reviewed.  Constitutional:      Appearance: Normal appearance.  HENT:     Head: Atraumatic.     Right Ear: Tympanic membrane and external ear normal.     Left Ear: Tympanic membrane and external ear normal.     Nose: Nose normal.     Mouth/Throat:     Mouth: Mucous membranes are moist.     Pharynx: Oropharyngeal exudate and posterior oropharyngeal erythema present.     Comments: Uvula midline, oral airway patent Eyes:     Extraocular Movements: Extraocular movements intact.     Conjunctiva/sclera: Conjunctivae normal.  Cardiovascular:  Rate and Rhythm: Normal  rate and regular rhythm.     Heart sounds: Normal heart sounds.  Pulmonary:     Effort: Pulmonary effort is normal.     Breath sounds: Normal breath sounds. No wheezing.  Musculoskeletal:        General: Normal range of motion.     Cervical back: Normal range of motion and neck supple.  Lymphadenopathy:     Cervical: Cervical adenopathy present.  Skin:    General: Skin is warm and dry.  Neurological:     Mental Status: She is alert and oriented to person, place, and time.  Psychiatric:        Mood and Affect: Mood normal.        Thought Content: Thought content normal.     UC Treatments / Results  Labs (all labs ordered are listed, but only abnormal results are displayed) Labs Reviewed  POCT RAPID STREP A (OFFICE) - Abnormal; Notable for the following components:      Result Value   Rapid Strep A Screen Positive (*)    All other components within normal limits    EKG   Radiology No results found.  Procedures Procedures (including critical care time)  Medications Ordered in UC Medications - No data to display  Initial Impression / Assessment and Plan / UC Course  I have reviewed the triage vital signs and the nursing notes.  Pertinent labs & imaging results that were available during my care of the patient were reviewed by me and considered in my medical decision making (see chart for details).     Vital signs benign and reassuring, rapid strep positive, treat with amoxicillin, viscous lidocaine, supportive care medications and home care.  Return for worsening symptoms.  Final Clinical Impressions(s) / UC Diagnoses   Final diagnoses:  Strep tonsillitis   Discharge Instructions   None    ED Prescriptions     Medication Sig Dispense Auth. Provider   amoxicillin (AMOXIL) 875 MG tablet Take 1 tablet (875 mg total) by mouth 2 (two) times daily. 20 tablet Particia Nearing, PA-C   lidocaine (XYLOCAINE) 2 % solution Use as directed 10 mLs in the mouth or  throat every 3 (three) hours as needed for mouth pain. 100 mL Particia Nearing, [place]      PDMP not reviewed this encounter.   Roosvelt Maser [place], [place] 09/29/21 [phone]

## 2021-11-06 ENCOUNTER — Other Ambulatory Visit (HOSPITAL_COMMUNITY): Payer: Self-pay

## 2021-11-06 DIAGNOSIS — Z124 Encounter for screening for malignant neoplasm of cervix: Secondary | ICD-10-CM | POA: Diagnosis not present

## 2021-11-06 DIAGNOSIS — Z01419 Encounter for gynecological examination (general) (routine) without abnormal findings: Secondary | ICD-10-CM | POA: Diagnosis not present

## 2021-11-06 DIAGNOSIS — R87810 Cervical high risk human papillomavirus (HPV) DNA test positive: Secondary | ICD-10-CM | POA: Diagnosis not present

## 2021-11-06 MED ORDER — NORETHINDRONE 0.35 MG PO TABS
1.0000 | ORAL_TABLET | Freq: Every day | ORAL | 4 refills | Status: DC
  Filled 2021-11-06: qty 84, 84d supply, fill #0

## 2021-11-26 ENCOUNTER — Other Ambulatory Visit (HOSPITAL_COMMUNITY): Payer: Self-pay

## 2021-12-11 DIAGNOSIS — R87611 Atypical squamous cells cannot exclude high grade squamous intraepithelial lesion on cytologic smear of cervix (ASC-H): Secondary | ICD-10-CM | POA: Diagnosis not present

## 2021-12-11 DIAGNOSIS — R85611 Atypical squamous cells cannot exclude high grade squamous intraepithelial lesion on cytologic smear of anus (ASC-H): Secondary | ICD-10-CM | POA: Diagnosis not present

## 2021-12-11 DIAGNOSIS — N871 Moderate cervical dysplasia: Secondary | ICD-10-CM | POA: Diagnosis not present

## 2021-12-11 DIAGNOSIS — Z3202 Encounter for pregnancy test, result negative: Secondary | ICD-10-CM | POA: Diagnosis not present

## 2021-12-17 ENCOUNTER — Other Ambulatory Visit (HOSPITAL_COMMUNITY): Payer: Self-pay

## 2021-12-18 ENCOUNTER — Other Ambulatory Visit (HOSPITAL_COMMUNITY): Payer: Self-pay

## 2021-12-18 MED ORDER — FLUOXETINE HCL 20 MG PO TABS
20.0000 mg | ORAL_TABLET | Freq: Every day | ORAL | 0 refills | Status: DC
  Filled 2021-12-18: qty 30, 30d supply, fill #0

## 2021-12-25 ENCOUNTER — Other Ambulatory Visit (HOSPITAL_COMMUNITY): Payer: Self-pay

## 2021-12-25 MED ORDER — WEGOVY 0.25 MG/0.5ML ~~LOC~~ SOAJ
0.2500 mg | SUBCUTANEOUS | 1 refills | Status: DC
  Filled 2021-12-25: qty 2, 28d supply, fill #0

## 2021-12-31 DIAGNOSIS — Z3202 Encounter for pregnancy test, result negative: Secondary | ICD-10-CM | POA: Diagnosis not present

## 2021-12-31 DIAGNOSIS — N871 Moderate cervical dysplasia: Secondary | ICD-10-CM | POA: Diagnosis not present

## 2022-02-11 ENCOUNTER — Other Ambulatory Visit (HOSPITAL_COMMUNITY): Payer: Self-pay

## 2022-02-12 ENCOUNTER — Other Ambulatory Visit (HOSPITAL_COMMUNITY): Payer: Self-pay

## 2022-05-25 DIAGNOSIS — N871 Moderate cervical dysplasia: Secondary | ICD-10-CM | POA: Diagnosis not present

## 2022-05-25 DIAGNOSIS — Z1231 Encounter for screening mammogram for malignant neoplasm of breast: Secondary | ICD-10-CM | POA: Diagnosis not present

## 2022-06-23 DIAGNOSIS — J069 Acute upper respiratory infection, unspecified: Secondary | ICD-10-CM | POA: Diagnosis not present

## 2022-06-26 ENCOUNTER — Telehealth: Payer: 59 | Admitting: Physician Assistant

## 2022-06-26 DIAGNOSIS — J208 Acute bronchitis due to other specified organisms: Secondary | ICD-10-CM

## 2022-06-26 DIAGNOSIS — B9689 Other specified bacterial agents as the cause of diseases classified elsewhere: Secondary | ICD-10-CM

## 2022-06-26 MED ORDER — BENZONATATE 100 MG PO CAPS: 100.0000 mg | ORAL_CAPSULE | Freq: Three times a day (TID) | ORAL | 0 refills | Status: DC | PRN

## 2022-06-26 MED ORDER — PREDNISONE 10 MG (21) PO TBPK: ORAL_TABLET | ORAL | 0 refills | Status: DC

## 2022-06-26 NOTE — Progress Notes (Signed)
We are sorry that you are not feeling well.  Here is how we plan to help!  Based on your presentation I believe you most likely have  A cough due to bacteria.  When patients have a fever and a productive cough with a change in color or increased sputum production, we are concerned about bacterial bronchitis.  If left untreated it can progress to pneumonia.  If your symptoms do not improve with your treatment plan it is important that you contact your provider.   Continue previously prescribed Antibiotic.   In addition you may use A non-prescription cough medication called Mucinex DM: take 2 tablets every 12 hours. and A prescription cough medication called Tessalon Perles '100mg'$ . You may take 1-2 capsules every 8 hours as needed for your cough.  Prednisone 10 mg daily for 6 days (see taper instructions below)  Directions for 6 day taper: Day 1: 2 tablets before breakfast, 1 after both lunch & dinner and 2 at bedtime Day 2: 1 tab before breakfast, 1 after both lunch & dinner and 2 at bedtime Day 3: 1 tab at each meal & 1 at bedtime Day 4: 1 tab at breakfast, 1 at lunch, 1 at bedtime Day 5: 1 tab at breakfast & 1 tab at bedtime Day 6: 1 tab at breakfast  From your responses in the eVisit questionnaire you describe inflammation in the upper respiratory tract which is causing a significant cough.  This is commonly called Bronchitis and has four common causes:   Allergies Viral Infections Acid Reflux Bacterial Infection Allergies, viruses and acid reflux are treated by controlling symptoms or eliminating the cause. An example might be a cough caused by taking certain blood pressure medications. You stop the cough by changing the medication. Another example might be a cough caused by acid reflux. Controlling the reflux helps control the cough.  USE OF BRONCHODILATOR ("RESCUE") INHALERS: There is a risk from using your bronchodilator too frequently.  The risk is that over-reliance on a medication  which only relaxes the muscles surrounding the breathing tubes can reduce the effectiveness of medications prescribed to reduce swelling and congestion of the tubes themselves.  Although you feel brief relief from the bronchodilator inhaler, your asthma may actually be worsening with the tubes becoming more swollen and filled with mucus.  This can delay other crucial treatments, such as oral steroid medications. If you need to use a bronchodilator inhaler daily, several times per day, you should discuss this with your provider.  There are probably better treatments that could be used to keep your asthma under control.     HOME CARE Only take medications as instructed by your medical team. Complete the entire course of an antibiotic. Drink plenty of fluids and get plenty of rest. Avoid close contacts especially the very young and the elderly Cover your mouth if you cough or cough into your sleeve. Always remember to wash your hands A steam or ultrasonic humidifier can help congestion.   GET HELP RIGHT AWAY IF: You develop worsening fever. You become short of breath You cough up blood. Your symptoms persist after you have completed your treatment plan MAKE SURE YOU  Understand these instructions. Will watch your condition. Will get help right away if you are not doing well or get worse.    Thank you for choosing an e-visit.  Your e-visit answers were reviewed by a board certified advanced clinical practitioner to complete your personal care plan. Depending upon the condition, your plan could  have included both over the counter or prescription medications.  Please review your pharmacy choice. Make sure the pharmacy is open so you can pick up prescription now. If there is a problem, you may contact your provider through [employer] and have the prescription routed to another pharmacy.  Your safety is important to [place]. If you have drug allergies check your prescription carefully.   For  the next 24 hours you can use MyChart to ask questions about today's visit, request a non-urgent call back, or ask for a work or school excuse. You will get an email in the next two days asking about your experience. I hope that your e-visit has been valuable and will speed your recovery.  I have spent 5 minutes in review of e-visit questionnaire, review and updating patient chart, medical decision making and response to patient.   Mar Daring, PA-C

## 2022-11-07 ENCOUNTER — Other Ambulatory Visit: Payer: Self-pay

## 2022-11-07 ENCOUNTER — Emergency Department (HOSPITAL_COMMUNITY)
Admission: EM | Admit: 2022-11-07 | Discharge: 2022-11-07 | Disposition: A | Discharge status: Home / Self Care | Payer: 59 | Attending: Emergency Medicine | Admitting: Emergency Medicine

## 2022-11-07 ENCOUNTER — Encounter (HOSPITAL_COMMUNITY): Payer: Self-pay

## 2022-11-07 DIAGNOSIS — Z79899 Other long term (current) drug therapy: Secondary | ICD-10-CM | POA: Diagnosis not present

## 2022-11-07 DIAGNOSIS — F410 Panic disorder [episodic paroxysmal anxiety] without agoraphobia: Secondary | ICD-10-CM

## 2022-11-07 DIAGNOSIS — I10 Essential (primary) hypertension: Secondary | ICD-10-CM | POA: Insufficient documentation

## 2022-11-07 DIAGNOSIS — R030 Elevated blood-pressure reading, without diagnosis of hypertension: Secondary | ICD-10-CM | POA: Diagnosis present

## 2022-11-07 HISTORY — DX: Anxiety disorder, unspecified: F41.9

## 2022-11-07 LAB — CBC WITH DIFFERENTIAL/PLATELET
Abs Immature Granulocytes: 0.02 10*3/uL (ref 0.00–0.07)
Basophils Absolute: 0.1 10*3/uL (ref 0.0–0.1)
Basophils Relative: 1 %
Eosinophils Absolute: 0.4 10*3/uL (ref 0.0–0.5)
Eosinophils Relative: 4 %
HCT: 40.7 % (ref 36.0–46.0)
Hemoglobin: 13.4 g/dL (ref 12.0–15.0)
Immature Granulocytes: 0 %
Lymphocytes Relative: 35 %
Lymphs Abs: 3.4 10*3/uL (ref 0.7–4.0)
MCH: 28.1 pg (ref 26.0–34.0)
MCHC: 32.9 g/dL (ref 30.0–36.0)
MCV: 85.3 fL (ref 80.0–100.0)
Monocytes Absolute: 0.7 10*3/uL (ref 0.1–1.0)
Monocytes Relative: 7 %
Neutro Abs: 5.2 10*3/uL (ref 1.7–7.7)
Neutrophils Relative %: 53 %
Platelets: 297 10*3/uL (ref 150–400)
RBC: 4.77 MIL/uL (ref 3.87–5.11)
RDW: 13.2 % (ref 11.5–15.5)
WBC: 9.8 10*3/uL (ref 4.0–10.5)
nRBC: 0 % (ref 0.0–0.2)

## 2022-11-07 LAB — COMPREHENSIVE METABOLIC PANEL
ALT: 58 U/L — ABNORMAL HIGH (ref 0–44)
AST: 29 U/L (ref 15–41)
Albumin: 4.1 g/dL (ref 3.5–5.0)
Alkaline Phosphatase: 69 U/L (ref 38–126)
Anion gap: 9 (ref 5–15)
BUN: 13 mg/dL (ref 6–20)
CO2: 27 mmol/L (ref 22–32)
Calcium: 9.3 mg/dL (ref 8.9–10.3)
Chloride: 103 mmol/L (ref 98–111)
Creatinine, Ser: 0.79 mg/dL (ref 0.44–1.00)
GFR, Estimated: >60 mL/min (ref >60)
Glucose, Bld: 116 mg/dL — ABNORMAL HIGH (ref 70–99)
Potassium: 4.1 mmol/L (ref 3.5–5.1)
Sodium: 139 mmol/L (ref 135–145)
Total Bilirubin: 0.5 mg/dL (ref 0.3–1.2)
Total Protein: 7.4 g/dL (ref 6.5–8.1)

## 2022-11-07 MED ORDER — LISINOPRIL 5 MG PO TABS: 5.0000 mg | ORAL_TABLET | Freq: Every day | ORAL | 1 refills | Status: AC

## 2022-11-07 MED ORDER — LISINOPRIL 5 MG PO TABS
5.0000 mg | ORAL_TABLET | ORAL | Status: AC
  Administered 2022-11-07: 5 mg via ORAL
  Filled 2022-11-07: qty 1

## 2022-11-07 NOTE — ED Triage Notes (Signed)
Pt arrived via POV c/o elevated BP at home tonight, reporting it was 170/102. Pt reported having an anxiety attack, followed lightheadedness and SOB. Pt denies CP, denies N/V.

## 2022-11-07 NOTE — ED Provider Notes (Signed)
[place] AT [facility] Provider Note   CSN: [id number] Arrival date & time: 11/07/22  0125     History  Chief Complaint  Patient presents with   Hypertension    Jennifer Mccormick is a 42 y.o. female.  Presents to the emergency department for evaluation of elevated blood pressure.  Patient reports that she had a panic attack tonight.  She was feeling like she could not breathe and her heart was racing around 160 bpm.  She reports that this is typical for her anxiety attacks, however usually when she has an episode the symptoms go away fairly quickly.  She still was not feeling well, checked her blood pressure and it was elevated.  She reports a blood pressure of 170/102.  Blood pressure for her is normally 130s over 80s.  No vision change, headache, chest pain.       Home Medications Prior to Admission medications   Medication Sig Start Date End Date Taking? Authorizing Provider  lisinopril (ZESTRIL) 5 MG tablet Take 1 tablet (5 mg total) by mouth daily. 11/07/22  Yes Jim Lundin, Canary Brim, MD  amoxicillin (AMOXIL) 875 MG tablet Take 1 tablet (875 mg total) by mouth 2 (two) times daily. 09/29/21   Particia Nearing, PA-C  benzonatate (TESSALON) 100 MG capsule Take 1 capsule (100 mg total) by mouth 3 (three) times daily as needed. 06/26/22   Margaretann Loveless, PA-C  FLUoxetine (PROZAC) 20 MG tablet TAKE 1 TABLET BY MOUTH ONCE DAILY. 09/29/19 09/28/20  Carylon Perches, MD  FLUoxetine (PROZAC) 20 MG tablet Take 1 tablet (20 mg total) by mouth daily. 12/18/21     ibuprofen (ADVIL) 800 MG tablet Take 1 tablet (800 mg total) by mouth 3 (three) times daily. 10/30/20     ibuprofen (ADVIL) 800 MG tablet Take 1 tablet (800 mg total) by mouth 3 (three) times daily. 04/25/21     lidocaine (XYLOCAINE) 2 % solution Use as directed 10 mLs in the mouth or throat every 3 (three) hours as needed for mouth pain. 09/29/21   Particia Nearing, PA-C  norethindrone  (MICRONOR) 0.35 MG tablet Take 1 tablet (0.35 mg total) by mouth daily. 11/06/21     norethindrone (ORTHO MICRONOR) 0.35 MG tablet Take 1 tablet (0.35 mg total) by mouth daily. 04/25/21     ondansetron (ZOFRAN-ODT) 4 MG disintegrating tablet Place 1 tablet every 8 hours by translingual route as needed. 05/22/21     predniSONE (STERAPRED UNI-PAK 21 TAB) 10 MG (21) TBPK tablet 6 day taper; take as directed on package instructions 06/26/22   Pennie, Shaw, PA-C  Semaglutide, 1 MG/DOSE, (OZEMPIC, 1 MG/DOSE,) 4 MG/3ML SOPN Inject 1 mg into the skin once a week as directed 06/26/21     Semaglutide,0.25 or 0.5MG /DOS, (OZEMPIC, 0.25 OR 0.5 MG/DOSE,) 2 MG/1.5ML SOPN Inject 0.25 mg into the skin once a week as directed. 05/29/21     Semaglutide-Weight Management (WEGOVY) 0.25 MG/0.5ML SOAJ Inject 0.25 mg into the skin once a week. 12/25/21     Testosterone (ANDROGEL PUMP) 20.25 MG/ACT (1.62%) GEL Apply 1 pump every day by transdermal route in the morning. 05/29/21     Testosterone (ANDROGEL PUMP) 20.25 MG/ACT (1.62%) GEL Place 1 Pump onto the skin every morning. 06/02/21     Vitamin D, Ergocalciferol, (DRISDOL) 1.25 MG (50000 UNIT) CAPS capsule Take 1 capsule (50,000 Units total) by mouth once a week as directed. 05/29/21         Allergies  Patient has no known allergies.    Review of Systems   Review of Systems  Physical Exam Updated Vital Signs BP (!) 160/95   Pulse 72   Temp 97.7 F (36.5 C) (Oral)   Resp 13   Ht 5\' 4"  (1.626 m)   Wt 110 kg   LMP  (LMP Unknown) Comment: ablation  SpO2 97%   BMI 41.63 kg/m  Physical Exam Vitals and nursing note reviewed.  Constitutional:      General: She is not in acute distress.    Appearance: She is well-developed.  HENT:     Head: Normocephalic and atraumatic.     Mouth/Throat:     Mouth: Mucous membranes are moist.  Eyes:     General: Vision grossly intact. Gaze aligned appropriately.     Extraocular Movements: Extraocular movements intact.      Conjunctiva/sclera: Conjunctivae normal.  Cardiovascular:     Rate and Rhythm: Normal rate and regular rhythm.     Pulses: Normal pulses.     Heart sounds: Normal heart sounds, S1 normal and S2 normal. No murmur heard.    No friction rub. No gallop.  Pulmonary:     Effort: Pulmonary effort is normal. No respiratory distress.     Breath sounds: Normal breath sounds.  Abdominal:     General: Bowel sounds are normal.     Palpations: Abdomen is soft.     Tenderness: There is no abdominal tenderness. There is no guarding or rebound.     Hernia: No hernia is present.  Musculoskeletal:        General: No swelling.     Cervical back: Full passive range of motion without pain, normal range of motion and neck supple. No spinous process tenderness or muscular tenderness. Normal range of motion.     Right lower leg: No edema.     Left lower leg: No edema.  Skin:    General: Skin is warm and dry.     Capillary Refill: Capillary refill takes less than 2 seconds.     Findings: No ecchymosis, erythema, rash or wound.  Neurological:     General: No focal deficit present.     Mental Status: She is alert and oriented to person, place, and time.     GCS: GCS eye subscore is 4. GCS verbal subscore is 5. GCS motor subscore is 6.     Cranial Nerves: Cranial nerves 2-12 are intact.     Sensory: Sensation is intact.     Motor: Motor function is intact.     Coordination: Coordination is intact.  Psychiatric:        Attention and Perception: Attention normal.        Mood and Affect: Mood normal.        Speech: Speech normal.        Behavior: Behavior normal.     ED Results / Procedures / Treatments   Labs (all labs ordered are listed, but only abnormal results are displayed) Labs Reviewed  COMPREHENSIVE METABOLIC PANEL - Abnormal; Notable for the following components:      Result Value   Glucose, Bld 116 (*)    ALT 58 (*)    All other components within normal limits  CBC WITH  DIFFERENTIAL/PLATELET    EKG EKG Interpretation Date/Time:  Saturday November 07 2022 01:36:33 EDT Ventricular Rate:  70 PR Interval:  165 QRS Duration:  106 QT Interval:  394 QTC Calculation: 426 R Axis:   64  Text  Interpretation: Sinus rhythm Normal ECG Confirmed by Gilda Crease [phone]) on 11/07/2022 2:20:38 AM  Radiology No results found.  Procedures Procedures    Medications Ordered in ED Medications  lisinopril (ZESTRIL) tablet 5 mg (5 mg Oral Given 11/07/22 0253)    ED Course/ Medical Decision Making/ A&P                             Medical Decision Making Amount and/or Complexity of Data Reviewed Labs: ordered.  Risk Prescription drug management.   Presented to the emergency department for evaluation of elevated blood pressure.  Patient reports that she has always had borderline high blood pressure and has recently put on weight over the last 1 and half years.  She had a panic attack tonight which prompted her to check her blood pressure.  It was 170/102.  Here in the ED she has been monitored and has only come down to 150/90.  Recommended low-dose blood pressure medication and follow-up with primary care.        Final Clinical Impression(s) / ED Diagnoses Final diagnoses:  Panic attack  Primary hypertension    Rx / DC Orders ED Discharge Orders          Ordered    lisinopril (ZESTRIL) 5 MG tablet  Daily        11/07/22 0245              Gilda Crease, MD 11/07/22 [phone]

## 2022-11-16 DIAGNOSIS — Z124 Encounter for screening for malignant neoplasm of cervix: Secondary | ICD-10-CM | POA: Diagnosis not present

## 2022-11-16 DIAGNOSIS — Z01419 Encounter for gynecological examination (general) (routine) without abnormal findings: Secondary | ICD-10-CM | POA: Diagnosis not present

## 2022-12-07 ENCOUNTER — Ambulatory Visit
Admission: RE | Admit: 2022-12-07 | Discharge: 2022-12-07 | Disposition: A | Discharge status: Home / Self Care | Payer: 59 | Source: Ambulatory Visit | Attending: Family Medicine | Admitting: Family Medicine

## 2022-12-07 VITALS — BP 146/82 | HR 82 | Temp 98.1°F | Resp 96

## 2022-12-07 DIAGNOSIS — S61011A Laceration without foreign body of right thumb without damage to nail, initial encounter: Secondary | ICD-10-CM | POA: Diagnosis not present

## 2022-12-07 DIAGNOSIS — Z23 Encounter for immunization: Secondary | ICD-10-CM | POA: Diagnosis not present

## 2022-12-07 MED ORDER — TETANUS-DIPHTH-ACELL PERTUSSIS 5-2.5-18.5 LF-MCG/0.5 IM SUSY
0.5000 mL | PREFILLED_SYRINGE | Freq: Once | INTRAMUSCULAR | Status: AC
  Administered 2022-12-07: 0.5 mL via INTRAMUSCULAR

## 2022-12-07 NOTE — ED Triage Notes (Signed)
Pt stated she slipped and cut her right thumb on a animal trapp that was outside. Does not know when last tdap shot was.

## 2022-12-07 NOTE — Discharge Instructions (Signed)
Clean the area once to twice daily with Hibiclens solution and applied Neosporin and a nonstick dressing.  Keep it wrapped until it is fully healed.  Ibuprofen and Tylenol for pain.  We have updated your tetanus shot today.

## 2022-12-07 NOTE — ED Provider Notes (Signed)
[facility]    CSN: [id number] Arrival date & time: 12/07/22  1601      History   Chief Complaint Chief Complaint  Patient presents with   Finger Injury    cut thumb on animal trap. need an updated tetanus booster - Entered by patient    HPI Jennifer Mccormick is a 42 y.o. female.   Patient presenting today with a laceration to the right thumb that occurred several hours ago when she fell onto an animal trap.  She states she cleaned it with peroxide and the bleeding was easily controlled with pressure.  Denies numbness, tingling, loss of range of motion.  She is unsure when her last tetanus shot was but is fairly certain it was more than 5 years ago.  Requesting it to be updated today.    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 10/08/2016   Rh negative status during pregnancy 10/08/2016   Vaginal bleeding in pregnancy, first trimester 10/08/2016    Past Surgical History:  Procedure Laterality Date   CESAREAN SECTION MULTI-GESTATIONAL N/A 03/19/2017   Procedure: CESAREAN SECTION MULTI-GESTATIONAL;  Surgeon: Carrington Clamp, MD;  [facility];  Service: Obstetrics;  Laterality: N/A;   CHOLECYSTECTOMY     COLPOSCOPY     invitrofertilization     LAPAROSCOPIC GASTRIC BAND REMOVAL WITH LAPAROSCOPIC GASTRIC SLEEVE RESECTION  2017   LEEP     WISDOM TOOTH EXTRACTION      OB History     Gravida  2   Para  2   Term  2   Preterm  0   AB  0   Living  3      SAB  0   IAB  0   Ectopic  0   Multiple  1   Live Births  3            Home Medications    Prior to Admission medications    Medication Sig Start Date End Date Taking? Authorizing Provider  FLUoxetine (PROZAC) 20 MG tablet Take 1 tablet (20 mg total) by mouth daily. 12/18/21  Yes   lisinopril (ZESTRIL) 5 MG tablet Take 1 tablet (5 mg total) by mouth daily. 11/07/22  Yes Pollina, Canary Brim, MD  amoxicillin (AMOXIL) 875 MG tablet Take 1 tablet (875 mg total) by mouth 2 (two) times daily. 09/29/21   Particia Nearing, PA-C  benzonatate (TESSALON) 100 MG capsule Take 1 capsule (100 mg total) by mouth 3 (three) times daily as needed. 06/26/22   Margaretann Loveless, PA-C  FLUoxetine (PROZAC) 20 MG tablet TAKE 1 TABLET BY MOUTH ONCE DAILY. 09/29/19 09/28/20  Carylon Perches, MD  ibuprofen (ADVIL) 800 MG tablet Take 1 tablet (800 mg total) by mouth 3 (three) times daily. 10/30/20     ibuprofen (ADVIL) 800 MG tablet Take 1 tablet (800 mg total) by mouth 3 (three) times daily. 04/25/21     lidocaine (XYLOCAINE) 2 % solution Use as directed 10 mLs in the mouth or throat every 3 (three) hours as needed for mouth  pain. 09/29/21   Particia Nearing, PA-C  norethindrone (MICRONOR) 0.35 MG tablet Take 1 tablet (0.35 mg total) by mouth daily. 11/06/21     norethindrone (ORTHO MICRONOR) 0.35 MG tablet Take 1 tablet (0.35 mg total) by mouth daily. 04/25/21     ondansetron (ZOFRAN-ODT) 4 MG disintegrating tablet Place 1 tablet every 8 hours by translingual route as needed. 05/22/21     predniSONE (STERAPRED UNI-PAK 21 TAB) 10 MG (21) TBPK tablet 6 day taper; take as directed on package instructions 06/26/22   Joellyn, Zoltowski, PA-C  Semaglutide, 1 MG/DOSE, (OZEMPIC, 1 MG/DOSE,) 4 MG/3ML SOPN Inject 1 mg into the skin once a week as directed 06/26/21     Semaglutide,0.25 or 0.5MG /DOS, (OZEMPIC, 0.25 OR 0.5 MG/DOSE,) 2 MG/1.5ML SOPN Inject 0.25 mg into the skin once a week as directed. 05/29/21     Semaglutide-Weight Management (WEGOVY) 0.25 MG/0.5ML SOAJ Inject 0.25 mg into the skin once a week. 12/25/21     Testosterone (ANDROGEL PUMP) 20.25  MG/ACT (1.62%) GEL Apply 1 pump every day by transdermal route in the morning. 05/29/21     Testosterone (ANDROGEL PUMP) 20.25 MG/ACT (1.62%) GEL Place 1 Pump onto the skin every morning. 06/02/21     Vitamin D, Ergocalciferol, (DRISDOL) 1.25 MG (50000 UNIT) CAPS capsule Take 1 capsule (50,000 Units total) by mouth once a week as directed. 05/29/21       Family History Family History  Problem Relation Age of Onset   Hypertension Mother    Alcohol abuse Father    Diabetes Brother    Hypertension Maternal Aunt    Heart disease Maternal Grandmother    Hypertension Maternal Grandmother    Diabetes Maternal Grandmother    Kidney disease Maternal Grandmother     Social History Social History   Tobacco Use   Smoking status: Never   Smokeless tobacco: Never  Vaping Use   Vaping status: Never Used  Substance Use Topics   Alcohol use: No   Drug use: No     Allergies   Patient has no known allergies.   Review of Systems Review of Systems Per HPI  Physical Exam Triage Vital Signs ED Triage Vitals [12/07/22 1659]  Encounter Vitals Group     BP (!) 146/82     Systolic BP Percentile      Diastolic BP Percentile      Pulse Rate 82     Resp (!) 96     Temp 98.1 F (36.7 C)     Temp Source Oral     SpO2 96 %     Weight      Height      Head Circumference      Peak Flow      Pain Score 2     Pain Loc      Pain Education      Exclude from Growth Chart    No data found.  Updated Vital Signs BP (!) 146/82 (BP Location: Right Arm)   Pulse 82   Temp 98.1 F (36.7 C) (Oral)   Resp (!) 96   LMP  (LMP Unknown) Comment: ablation  SpO2 96%   Visual Acuity Right Eye Distance:   Left Eye Distance:   Bilateral Distance:    Right Eye Near:   Left Eye Near:    Bilateral Near:     Physical Exam Vitals and nursing note reviewed.  Constitutional:      Appearance: Normal appearance. She is not ill-appearing.  HENT:     Head: Atraumatic.  Eyes:     Extraocular  Movements: Extraocular movements intact.     Conjunctiva/sclera: Conjunctivae normal.  Cardiovascular:     Rate and Rhythm: Normal rate and regular rhythm.     Heart sounds: Normal heart sounds.  Pulmonary:     Effort: Pulmonary effort is normal.     Breath sounds: Normal breath sounds.  Musculoskeletal:        General: Tenderness and signs of injury present. No swelling or deformity. Normal range of motion.     Cervical back: Normal range of motion and neck supple.  Skin:    General: Skin is warm and dry.     Comments: 2 linear superficial lacerations to the palmar aspect of the right thumb.  Bleeding well-controlled, wounds well-approximated, no foreign bodies appreciable  Neurological:     Mental Status: She is alert and oriented to person, place, and time.     Comments: Right upper extremity neurovascularly intact  Psychiatric:        Mood and Affect: Mood normal.        Thought Content: Thought content normal.        Judgment: Judgment normal.      UC Treatments / Results  Labs (all labs ordered are listed, but only abnormal results are displayed) Labs Reviewed - No data to display  EKG   Radiology No results found.  Procedures Procedures (including critical care time)  Medications Ordered in UC Medications  Tdap (BOOSTRIX) injection 0.5 mL (has no administration in time range)    Initial Impression / Assessment and Plan / UC Course  I have reviewed the triage vital signs and the nursing notes.  Pertinent labs & imaging results that were available during my care of the patient were reviewed by me and considered in my medical decision making (see chart for details).     Wounds cleaned and dressed today, tetanus updated.  Patient declines Dermabond or other closure which is reasonable given the superficial nature.  Discussed home wound care, return precautions.  Final Clinical Impressions(s) / UC Diagnoses   Final diagnoses:  Laceration of right thumb  without foreign body without damage to nail, initial encounter  Need for Tdap vaccination     Discharge Instructions      Clean the area once to twice daily with Hibiclens solution and applied Neosporin and a nonstick dressing.  Keep it wrapped until it is fully healed.  Ibuprofen and Tylenol for pain.  We have updated your tetanus shot today.    ED Prescriptions   None    PDMP not reviewed this encounter.   Particia Nearing, [place] 12/07/22 1713

## 2022-12-16 DIAGNOSIS — R891 Abnormal level of hormones in specimens from other organs, systems and tissues: Secondary | ICD-10-CM | POA: Diagnosis not present

## 2022-12-16 DIAGNOSIS — E6609 Other obesity due to excess calories: Secondary | ICD-10-CM | POA: Diagnosis not present

## 2022-12-16 DIAGNOSIS — L658 Other specified nonscarring hair loss: Secondary | ICD-10-CM | POA: Diagnosis not present

## 2022-12-16 DIAGNOSIS — E663 Overweight: Secondary | ICD-10-CM | POA: Diagnosis not present

## 2022-12-16 DIAGNOSIS — R531 Weakness: Secondary | ICD-10-CM | POA: Diagnosis not present

## 2022-12-16 DIAGNOSIS — R5382 Chronic fatigue, unspecified: Secondary | ICD-10-CM | POA: Diagnosis not present

## 2022-12-16 DIAGNOSIS — N926 Irregular menstruation, unspecified: Secondary | ICD-10-CM | POA: Diagnosis not present

## 2023-01-17 ENCOUNTER — Telehealth: Payer: 59 | Admitting: Nurse Practitioner

## 2023-01-17 DIAGNOSIS — R399 Unspecified symptoms and signs involving the genitourinary system: Secondary | ICD-10-CM

## 2023-01-17 MED ORDER — CEPHALEXIN 500 MG PO CAPS
500.0000 mg | ORAL_CAPSULE | Freq: Two times a day (BID) | ORAL | 0 refills | Status: AC
Start: 2023-01-17 — End: 2023-01-24

## 2023-01-17 NOTE — Progress Notes (Signed)

## 2023-01-17 NOTE — Progress Notes (Signed)
I have spent 5 minutes in review of e-visit questionnaire, review and updating patient chart, medical decision making and response to patient.  ° °Jerrell Mangel W Secilia Apps, NP ° °  °

## 2023-03-30 DIAGNOSIS — L658 Other specified nonscarring hair loss: Secondary | ICD-10-CM | POA: Diagnosis not present

## 2023-03-30 DIAGNOSIS — R891 Abnormal level of hormones in specimens from other organs, systems and tissues: Secondary | ICD-10-CM | POA: Diagnosis not present

## 2023-03-30 DIAGNOSIS — R5382 Chronic fatigue, unspecified: Secondary | ICD-10-CM | POA: Diagnosis not present

## 2023-03-30 DIAGNOSIS — E663 Overweight: Secondary | ICD-10-CM | POA: Diagnosis not present

## 2023-04-10 DIAGNOSIS — M549 Dorsalgia, unspecified: Secondary | ICD-10-CM | POA: Diagnosis not present

## 2023-04-10 DIAGNOSIS — N10 Acute tubulo-interstitial nephritis: Secondary | ICD-10-CM | POA: Diagnosis not present

## 2023-05-25 ENCOUNTER — Other Ambulatory Visit (HOSPITAL_COMMUNITY): Payer: Self-pay | Admitting: Obstetrics and Gynecology

## 2023-05-25 DIAGNOSIS — Z1231 Encounter for screening mammogram for malignant neoplasm of breast: Secondary | ICD-10-CM

## 2023-06-02 ENCOUNTER — Ambulatory Visit (HOSPITAL_COMMUNITY): Payer: 59

## 2023-06-09 ENCOUNTER — Ambulatory Visit (HOSPITAL_COMMUNITY)
Admission: RE | Admit: 2023-06-09 | Discharge: 2023-06-09 | Disposition: A | Discharge status: Home / Self Care | Payer: 59 | Source: Ambulatory Visit | Attending: Obstetrics and Gynecology | Admitting: Obstetrics and Gynecology

## 2023-06-09 DIAGNOSIS — Z1231 Encounter for screening mammogram for malignant neoplasm of breast: Secondary | ICD-10-CM | POA: Diagnosis not present

## 2024-05-31 ENCOUNTER — Other Ambulatory Visit (HOSPITAL_COMMUNITY): Payer: Self-pay | Admitting: Obstetrics and Gynecology

## 2024-05-31 DIAGNOSIS — Z1231 Encounter for screening mammogram for malignant neoplasm of breast: Secondary | ICD-10-CM

## 2024-06-09 ENCOUNTER — Other Ambulatory Visit (HOSPITAL_COMMUNITY): Payer: Self-pay | Admitting: Obstetrics and Gynecology

## 2024-06-09 ENCOUNTER — Ambulatory Visit (HOSPITAL_COMMUNITY): Admission: RE | Admit: 2024-06-09 | Source: Ambulatory Visit

## 2024-06-09 ENCOUNTER — Inpatient Hospital Stay: Admission: RE | Admit: 2024-06-09 | Payer: Self-pay | Source: Ambulatory Visit

## 2024-06-09 ENCOUNTER — Encounter (HOSPITAL_COMMUNITY): Payer: Self-pay

## 2024-06-09 DIAGNOSIS — Z1231 Encounter for screening mammogram for malignant neoplasm of breast: Secondary | ICD-10-CM
